# Patient Record
Sex: Male | Born: 1980 | Race: White | Hispanic: No | Marital: Single | Smoking: Current every day smoker
Health system: Southern US, Community
[De-identification: ages and names within clinical notes are randomized; demographics above are authoritative.]

## PROBLEM LIST (undated history)

## (undated) DIAGNOSIS — F1990 Other psychoactive substance use, unspecified, uncomplicated: Secondary | ICD-10-CM

## (undated) HISTORY — PX: OTHER SURGICAL HISTORY: SHX169

---

## 2009-08-21 ENCOUNTER — Emergency Department (HOSPITAL_COMMUNITY): Admission: EM | Admit: 2009-08-21 | Discharge: 2009-08-21 | Payer: Self-pay | Admitting: Emergency Medicine

## 2011-08-26 ENCOUNTER — Emergency Department (HOSPITAL_COMMUNITY)
Admission: EM | Admit: 2011-08-26 | Discharge: 2011-08-26 | Disposition: A | Discharge status: Home / Self Care | Payer: Self-pay | Attending: Emergency Medicine | Admitting: Emergency Medicine

## 2011-08-26 DIAGNOSIS — R109 Unspecified abdominal pain: Secondary | ICD-10-CM | POA: Insufficient documentation

## 2011-08-26 DIAGNOSIS — K089 Disorder of teeth and supporting structures, unspecified: Secondary | ICD-10-CM | POA: Insufficient documentation

## 2011-08-26 DIAGNOSIS — K029 Dental caries, unspecified: Secondary | ICD-10-CM | POA: Insufficient documentation

## 2011-08-26 DIAGNOSIS — M549 Dorsalgia, unspecified: Secondary | ICD-10-CM | POA: Insufficient documentation

## 2011-08-26 DIAGNOSIS — I10 Essential (primary) hypertension: Secondary | ICD-10-CM | POA: Insufficient documentation

## 2013-02-21 ENCOUNTER — Encounter (HOSPITAL_COMMUNITY): Payer: Self-pay | Admitting: *Deleted

## 2013-02-21 ENCOUNTER — Emergency Department (HOSPITAL_COMMUNITY)
Admission: EM | Admit: 2013-02-21 | Discharge: 2013-02-21 | Disposition: A | Discharge status: Home / Self Care | Payer: Self-pay | Attending: Emergency Medicine | Admitting: Emergency Medicine

## 2013-02-21 DIAGNOSIS — F17200 Nicotine dependence, unspecified, uncomplicated: Secondary | ICD-10-CM | POA: Insufficient documentation

## 2013-02-21 DIAGNOSIS — R1084 Generalized abdominal pain: Secondary | ICD-10-CM | POA: Insufficient documentation

## 2013-02-21 DIAGNOSIS — R5383 Other fatigue: Secondary | ICD-10-CM | POA: Insufficient documentation

## 2013-02-21 DIAGNOSIS — F1123 Opioid dependence with withdrawal: Secondary | ICD-10-CM

## 2013-02-21 DIAGNOSIS — R112 Nausea with vomiting, unspecified: Secondary | ICD-10-CM | POA: Insufficient documentation

## 2013-02-21 DIAGNOSIS — F19939 Other psychoactive substance use, unspecified with withdrawal, unspecified: Secondary | ICD-10-CM | POA: Insufficient documentation

## 2013-02-21 DIAGNOSIS — R5381 Other malaise: Secondary | ICD-10-CM | POA: Insufficient documentation

## 2013-02-21 DIAGNOSIS — R197 Diarrhea, unspecified: Secondary | ICD-10-CM | POA: Insufficient documentation

## 2013-02-21 DIAGNOSIS — R6883 Chills (without fever): Secondary | ICD-10-CM | POA: Insufficient documentation

## 2013-02-21 DIAGNOSIS — M549 Dorsalgia, unspecified: Secondary | ICD-10-CM | POA: Insufficient documentation

## 2013-02-21 MED ORDER — PROCHLORPERAZINE EDISYLATE 5 MG/ML IJ SOLN
10.0000 mg | Freq: Once | INTRAMUSCULAR | Status: AC
  Administered 2013-02-21: 10 mg via INTRAVENOUS
  Filled 2013-02-21: qty 2

## 2013-02-21 MED ORDER — CLONIDINE HCL 0.1 MG PO TABS
0.1000 mg | ORAL_TABLET | Freq: Once | ORAL | Status: AC
  Administered 2013-02-21: 0.1 mg via ORAL
  Filled 2013-02-21 (×2): qty 1

## 2013-02-21 MED ORDER — LORAZEPAM 2 MG/ML IJ SOLN
1.0000 mg | Freq: Once | INTRAMUSCULAR | Status: AC
  Administered 2013-02-21: 1 mg via INTRAVENOUS
  Filled 2013-02-21: qty 1

## 2013-02-21 MED ORDER — PROMETHAZINE HCL 25 MG/ML IJ SOLN
25.0000 mg | Freq: Once | INTRAMUSCULAR | Status: AC
  Administered 2013-02-21: 25 mg via INTRAVENOUS
  Filled 2013-02-21: qty 1

## 2013-02-21 MED ORDER — CLONIDINE HCL 0.2 MG PO TABS: 0.1000 mg | ORAL_TABLET | Freq: Two times a day (BID) | ORAL | Status: DC

## 2013-02-21 MED ORDER — IBUPROFEN 400 MG PO TABS: 600.0000 mg | ORAL_TABLET | Freq: Four times a day (QID) | ORAL | Status: DC | PRN

## 2013-02-21 MED ORDER — PROMETHAZINE HCL 25 MG PO TABS: 25.0000 mg | ORAL_TABLET | Freq: Four times a day (QID) | ORAL | Status: DC | PRN

## 2013-02-21 MED ORDER — CLONIDINE HCL 0.1 MG PO TABS
0.1000 mg | ORAL_TABLET | Freq: Once | ORAL | Status: AC
  Administered 2013-02-21: 0.1 mg via ORAL
  Filled 2013-02-21: qty 1

## 2013-02-21 MED ORDER — PROMETHAZINE HCL 25 MG/ML IJ SOLN
25.0000 mg | Freq: Once | INTRAMUSCULAR | Status: DC
  Filled 2013-02-21: qty 1

## 2013-02-21 NOTE — ED Notes (Signed)
Pt unable to answer questions, is moaning, denies any pain at this time

## 2013-02-21 NOTE — ED Provider Notes (Signed)
History     CSN: [id number]  Arrival date & time 02/21/13  1355   First MD Initiated Contact with Patient 02/21/13 1408      Chief Complaint  Patient presents with  . Withdrawal   Level 5 caveat due to altered mental status.  (Consider location/radiation/quality/duration/timing/severity/associated sxs/prior treatment) The history is provided by the patient.   patient presents in opioid withdrawal period he ran out of his methadone yesterday and got from a friend what he thought was more methadone. It was actually naltrexone and he began to feel bad. He's had nausea without vomiting. He's had diarrhea. He states that he hurts all over. He is moaning and somewhat uncooperative with examination.  History reviewed. No pertinent past medical history.  History reviewed. No pertinent past surgical history.  No family history on file.  History  Substance Use Topics  . Smoking status: Current Every Day Smoker  . Smokeless tobacco: Not on file  . Alcohol Use: No      Review of Systems  Unable to perform ROS: Acuity of condition  Constitutional: Positive for chills and fatigue.  HENT: Negative for neck stiffness.   Respiratory: Negative for cough and shortness of breath.   Gastrointestinal: Positive for nausea, abdominal pain and diarrhea. Negative for vomiting.  Genitourinary: Positive for flank pain.  Musculoskeletal: Positive for back pain.  Neurological: Negative for headaches.    Allergies  Review of patient's allergies indicates no known allergies.  Home Medications  No current outpatient prescriptions on file.  BP 149/73  Pulse 66  Temp(Src) 98.1 F (36.7 C) (Oral)  Resp 18  SpO2 100%  Physical Exam  Nursing note and vitals reviewed. Constitutional: He appears well-developed and well-nourished.  HENT:  Head: Normocephalic and atraumatic.  Eyes: EOM are normal.  Pupils are dilated bilaterally  Neck: Normal range of motion. Neck supple.  Cardiovascular:  Normal rate, regular rhythm and normal heart sounds.   No murmur heard. Pulmonary/Chest: Effort normal and breath sounds normal.  Abdominal: Soft. Bowel sounds are normal. He exhibits no distension and no mass. There is tenderness. There is no rebound and no guarding.  Mild diffuse abdominal tenderness.  Musculoskeletal: Normal range of motion. He exhibits no edema.  Neurological: He is alert. No cranial nerve deficit.  Patient is moaning in bed it is somewhat difficult to understand. He will follow commands.  Skin: Skin is warm and dry.  Psychiatric: He has a normal mood and affect.    ED Course  Procedures (including critical care time)  Labs Reviewed - No data to display No results found.   1. Opiate withdrawal       MDM  Patient with apparent opioid withdrawal after getting naltrexone instead of methadone. Patient's had some diarrhea. Has been given symptomatic relief. Will monitor for control of symptoms.        Juliet Rude. Rubin Payor, MD 02/21/13 1610

## 2013-02-21 NOTE — ED Notes (Signed)
AVW:[id number]<XB> Expected date:<BR> Expected time:<BR> Means of arrival:<BR> Comments:<BR> Withdrawal/combative
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
## 2013-02-21 NOTE — ED Notes (Signed)
Poison control called, recommends clonidine 0.1mg  x3 daily

## 2013-02-21 NOTE — ED Provider Notes (Signed)
Pt with hx of being on methadone, took revia/narcan today - and went into opiod withdrawal. I assumed care of this patient from Dr. Rubin Payor. Pt was extremely uncomfortable - so we continued to monitor him. I also called Poison control, to ask them when he can restart his methadone - as family wanted him to be started on methadone immediately. The recs from Poison Control was to give patient adjunct - 0.1 mg clonidine tid and short acting opoids at our discretion an dto start methadone as per schedule tomorrow. Pt took the clonidine and felt a little better. Plan was for me to give him another round of clonidine here, and then discharge to family at 11 pm.   Derwood Kaplan, MD 02/21/13 2312

## 2013-03-23 ENCOUNTER — Encounter (HOSPITAL_COMMUNITY): Payer: Self-pay | Admitting: Emergency Medicine

## 2013-03-23 ENCOUNTER — Emergency Department (HOSPITAL_COMMUNITY)
Admission: EM | Admit: 2013-03-23 | Discharge: 2013-03-23 | Disposition: A | Discharge status: Home / Self Care | Payer: Self-pay | Attending: Emergency Medicine | Admitting: Emergency Medicine

## 2013-03-23 DIAGNOSIS — F17200 Nicotine dependence, unspecified, uncomplicated: Secondary | ICD-10-CM | POA: Insufficient documentation

## 2013-03-23 DIAGNOSIS — K029 Dental caries, unspecified: Secondary | ICD-10-CM | POA: Insufficient documentation

## 2013-03-23 DIAGNOSIS — Z79899 Other long term (current) drug therapy: Secondary | ICD-10-CM | POA: Insufficient documentation

## 2013-03-23 DIAGNOSIS — K089 Disorder of teeth and supporting structures, unspecified: Secondary | ICD-10-CM | POA: Insufficient documentation

## 2013-03-23 DIAGNOSIS — K0889 Other specified disorders of teeth and supporting structures: Secondary | ICD-10-CM

## 2013-03-23 MED ORDER — PENICILLIN V POTASSIUM 500 MG PO TABS: 500.0000 mg | ORAL_TABLET | Freq: Three times a day (TID) | ORAL | Status: DC

## 2013-03-23 MED ORDER — IBUPROFEN 600 MG PO TABS: 600.0000 mg | ORAL_TABLET | Freq: Four times a day (QID) | ORAL | Status: DC | PRN

## 2013-03-23 NOTE — ED Notes (Signed)
Pt c/o right lower dental pain x 2 weeks that is having increased pain

## 2013-03-23 NOTE — ED Provider Notes (Signed)
History     CSN: [id number]  Arrival date & time 03/23/13  4098   First MD Initiated Contact with Patient 03/23/13 [phone]      Chief Complaint  Patient presents with  . Dental Pain    (Consider location/radiation/quality/duration/timing/severity/associated sxs/prior treatment) HPI Comments: Patient presents with complaint of right mandibular tooth pain and swelling for the past several weeks worsening over the past 2 days. Patient takes methadone and has been gargling salt water but states that this does not help. No trouble breathing, fever, neck swelling, facial swelling. The onset of this condition was acute. The course is constant. Aggravating factors: none. Alleviating factors: none.    Patient is a 32 y.o. male presenting with tooth pain. The history is provided by the patient.  Dental PainPrimary symptoms do not include headaches, fever, shortness of breath or sore throat.  Additional symptoms do not include: facial swelling, trouble swallowing and ear pain.    History reviewed. No pertinent past medical history.  History reviewed. No pertinent past surgical history.  History reviewed. No pertinent family history.  History  Substance Use Topics  . Smoking status: Current Every Day Smoker  . Smokeless tobacco: Not on file  . Alcohol Use: No      Review of Systems  Constitutional: Negative for fever.  HENT: Positive for dental problem. Negative for ear pain, sore throat, facial swelling, trouble swallowing and neck pain.   Respiratory: Negative for shortness of breath and stridor.   Skin: Negative for color change.  Neurological: Negative for headaches.    Allergies  Review of patient's allergies indicates no known allergies.  Home Medications   Current Outpatient Rx  Name  Route  Sig  Dispense  Refill  . methadone (DOLOPHINE) 10 MG/5ML solution   Oral   Take 125 mg by mouth daily.         Marland Kitchen ibuprofen (ADVIL,MOTRIN) 600 MG tablet   Oral   Take 1 tablet  (600 mg total) by mouth every 6 (six) hours as needed for pain.   20 tablet   0   . penicillin v potassium (VEETID) 500 MG tablet   Oral   Take 1 tablet (500 mg total) by mouth 3 (three) times daily.   21 tablet   0     BP 150/94  Pulse 81  Temp(Src) 98.5 F (36.9 C) (Oral)  Resp 18  SpO2 97%  Physical Exam  Nursing note and vitals reviewed. Constitutional: He appears well-developed and well-nourished.  HENT:  Head: Normocephalic and atraumatic. No trismus in the jaw.  Right Ear: Tympanic membrane, external ear and ear canal normal.  Left Ear: Tympanic membrane, external ear and ear canal normal.  Nose: Nose normal.  Mouth/Throat: Uvula is midline, oropharynx is clear and moist and mucous membranes are normal. Abnormal dentition. Dental caries present. No dental abscesses or edematous. No tonsillar abscesses.  Teeth in poor repair with advanced periodontal disease. Multiple active caries noted. Patient with broken tooth, right mandibular first molar with tenderness and erythema of the gumline in that area. No facial swelling or definite abscess.  Eyes: Pupils are equal, round, and reactive to light.  Neck: Normal range of motion. Neck supple.  No neck swelling or Lugwig's angina  Neurological: He is alert.  Skin: Skin is warm and dry.  Psychiatric: He has a normal mood and affect.    ED Course  Procedures (including critical care time)  Labs Reviewed - No data to display No results found.  1. Toothache    Please read and follow all provided instructions.  Your diagnoses today include:  1. Toothache    9:58 AM Patient seen and examined. Work-up initiated. Medications ordered.   Vital signs reviewed and are as follows: Filed Vitals:   03/23/13 0940  BP: 150/94  Pulse: 81  Temp: 98.5 F (36.9 C)  Resp: 18    Patient counseled to take prescribed medications as directed, return with worsening facial or neck swelling, and to follow-up with their dentist as  soon as possible.      MDM  Patient with toothache.  No gross abscess but I suspect early infection.  Exam unconcerning for Ludwig's angina or other deep tissue infection in neck.  Will treat with penicillin and pain medicine.  Urged patient to follow-up with dentist.          Renne Crigler, PA-C 03/23/13 1000

## 2013-03-24 NOTE — ED Provider Notes (Signed)
Medical screening examination/treatment/procedure(s) were performed by non-physician practitioner and as supervising physician I was immediately available for consultation/collaboration.   Suzi Roots, MD 03/24/13 [phone]

## 2013-11-16 ENCOUNTER — Encounter (HOSPITAL_COMMUNITY): Payer: Self-pay | Admitting: Emergency Medicine

## 2013-11-16 ENCOUNTER — Emergency Department (HOSPITAL_COMMUNITY): Payer: Self-pay

## 2013-11-16 ENCOUNTER — Emergency Department (HOSPITAL_COMMUNITY)
Admission: EM | Admit: 2013-11-16 | Discharge: 2013-11-16 | Disposition: A | Discharge status: Home / Self Care | Payer: Self-pay | Attending: Emergency Medicine | Admitting: Emergency Medicine

## 2013-11-16 DIAGNOSIS — IMO0002 Reserved for concepts with insufficient information to code with codable children: Secondary | ICD-10-CM | POA: Insufficient documentation

## 2013-11-16 DIAGNOSIS — L02512 Cutaneous abscess of left hand: Secondary | ICD-10-CM

## 2013-11-16 DIAGNOSIS — Z79899 Other long term (current) drug therapy: Secondary | ICD-10-CM | POA: Insufficient documentation

## 2013-11-16 DIAGNOSIS — F17200 Nicotine dependence, unspecified, uncomplicated: Secondary | ICD-10-CM | POA: Insufficient documentation

## 2013-11-16 MED ORDER — CLINDAMYCIN HCL 150 MG PO CAPS: 450.0000 mg | ORAL_CAPSULE | Freq: Three times a day (TID) | ORAL | Status: AC

## 2013-11-16 MED ORDER — CLINDAMYCIN HCL 300 MG PO CAPS
450.0000 mg | ORAL_CAPSULE | Freq: Once | ORAL | Status: DC
  Filled 2013-11-16 (×2): qty 1

## 2013-11-16 NOTE — ED Notes (Signed)
Patient transported to X-ray 

## 2013-11-16 NOTE — ED Notes (Signed)
Pt dc to home. Pt sts understanding to dc instructions. Pt ambulatory to exit without difficulty. 
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
## 2013-11-16 NOTE — ED Notes (Addendum)
Pt is here with left ring finger infection and some discoloration after cutting it on a can

## 2013-11-22 NOTE — ED Provider Notes (Signed)
I saw and evaluated the patient, reviewed the resident's note and I agree with the findings and plan.  EKG Interpretation   None      Patient finger infection. Drained in ED by Dr. Gordy Levan under my direct supervision. We'll have followup after oral antibiotics.  Juliet Rude. Rubin Payor, MD 11/22/13 1549

## 2015-05-19 ENCOUNTER — Emergency Department (HOSPITAL_COMMUNITY)
Admission: EM | Admit: 2015-05-19 | Discharge: 2015-05-19 | Disposition: A | Discharge status: Home / Self Care | Payer: Self-pay | Attending: Emergency Medicine | Admitting: Emergency Medicine

## 2015-05-19 ENCOUNTER — Encounter (HOSPITAL_COMMUNITY): Payer: Self-pay | Admitting: Emergency Medicine

## 2015-05-19 DIAGNOSIS — Z720 Tobacco use: Secondary | ICD-10-CM | POA: Insufficient documentation

## 2015-05-19 DIAGNOSIS — K088 Other specified disorders of teeth and supporting structures: Secondary | ICD-10-CM | POA: Insufficient documentation

## 2015-05-19 DIAGNOSIS — Z79899 Other long term (current) drug therapy: Secondary | ICD-10-CM | POA: Insufficient documentation

## 2015-05-19 DIAGNOSIS — K029 Dental caries, unspecified: Secondary | ICD-10-CM | POA: Insufficient documentation

## 2015-05-19 DIAGNOSIS — K0889 Other specified disorders of teeth and supporting structures: Secondary | ICD-10-CM

## 2015-05-19 MED ORDER — HYDROCODONE-ACETAMINOPHEN 5-325 MG PO TABS: 2.0000 | ORAL_TABLET | ORAL | Status: DC | PRN

## 2015-05-19 MED ORDER — PENICILLIN V POTASSIUM 500 MG PO TABS: 500.0000 mg | ORAL_TABLET | Freq: Four times a day (QID) | ORAL | Status: AC

## 2015-05-19 MED ORDER — IBUPROFEN 800 MG PO TABS: 800.0000 mg | ORAL_TABLET | Freq: Three times a day (TID) | ORAL | Status: DC
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
## 2015-05-19 NOTE — Discharge Instructions (Signed)
Dental Pain °A tooth ache may be caused by cavities (tooth decay). Cavities expose the nerve of the tooth to air and hot or cold temperatures. It may come from an infection or abscess (also called a boil or furuncle) around your tooth. It is also often caused by dental caries (tooth decay). This causes the pain you are having. °DIAGNOSIS  °Your caregiver can diagnose this problem by exam. °TREATMENT  °· If caused by an infection, it may be treated with medications which kill germs (antibiotics) and pain medications as prescribed by your caregiver. Take medications as directed. °· Only take over-the-counter or prescription medicines for pain, discomfort, or fever as directed by your caregiver. °· Whether the tooth ache today is caused by infection or dental disease, you should see your dentist as soon as possible for further care. °SEEK MEDICAL CARE IF: °The exam and treatment you received today has been provided on an emergency basis only. This is not a substitute for complete medical or dental care. If your problem worsens or new problems (symptoms) appear, and you are unable to meet with your dentist, call or return to this location. °SEEK IMMEDIATE MEDICAL CARE IF:  °· You have a fever. °· You develop redness and swelling of your face, jaw, or neck. °· You are unable to open your mouth. °· You have severe pain uncontrolled by pain medicine. °MAKE SURE YOU:  °· Understand these instructions. °· Will watch your condition. °· Will get help right away if you are not doing well or get worse. °Document Released: 11/11/2005 Document Revised: 02/03/2012 Document Reviewed: 06/29/2008 °ExitCare® Patient Information ©2015 [employer]. This information is not intended to replace advice given to you by your health care provider. Make sure you discuss any questions you have with your health care provider. ° °Dental Care and Dentist Visits °Dental care supports good overall health. Regular dental visits can also help you  avoid dental pain, bleeding, infection, and other more serious health problems in the future. It is important to keep the mouth healthy because diseases in the teeth, gums, and other oral tissues can spread to other areas of the body. Some problems, such as diabetes, heart disease, and pre-term labor have been associated with poor oral health.  °See your dentist every 6 months. If you experience emergency problems such as a toothache or broken tooth, go to the dentist right away. If you see your dentist regularly, you may catch problems early. It is easier to be treated for problems in the early stages.  °WHAT TO EXPECT AT A DENTIST VISIT  °Your dentist will look for many common oral health problems and recommend proper treatment. At your regular dental visit, you can expect: °· Gentle cleaning of the teeth and gums. This includes scraping and polishing. This helps to remove the sticky substance around the teeth and gums (plaque). Plaque forms in the mouth shortly after eating. Over time, plaque hardens on the teeth as tartar. If tartar is not removed regularly, it can cause problems. Cleaning also helps remove stains. °· Periodic X-rays. These pictures of the teeth and supporting bone will help your dentist assess the health of your teeth. °· Periodic fluoride treatments. Fluoride is a natural mineral shown to help strengthen teeth. Fluoride treatment involves applying a fluoride gel or varnish to the teeth. It is most commonly done in children. °· Examination of the mouth, tongue, jaws, teeth, and gums to look for any oral health problems, such as: °¨ Cavities (dental caries). This is   decay on the tooth caused by plaque, sugar, and acid in the mouth. It is best to catch a cavity when it is small. °¨ Inflammation of the gums caused by plaque buildup (gingivitis). °¨ Problems with the mouth or malformed or misaligned teeth. °¨ Oral cancer or other diseases of the soft tissues or jaws.  °KEEP YOUR TEETH AND GUMS  HEALTHY °For healthy teeth and gums, follow these general guidelines as well as your dentist's specific advice: °· Have your teeth professionally cleaned at the dentist every 6 months. °· Brush twice daily with a fluoride toothpaste. °· Floss your teeth daily.  °· Ask your dentist if you need fluoride supplements, treatments, or fluoride toothpaste. °· Eat a healthy diet. Reduce foods and drinks with added sugar. °· Avoid smoking. °TREATMENT FOR ORAL HEALTH PROBLEMS °If you have oral health problems, treatment varies depending on the conditions present in your teeth and gums. °· Your caregiver will most likely recommend good oral hygiene at each visit. °· For cavities, gingivitis, or other oral health disease, your caregiver will perform a procedure to treat the problem. This is typically done at a separate appointment. Sometimes your caregiver will refer you to another dental specialist for specific tooth problems or for surgery. °SEEK IMMEDIATE DENTAL CARE IF: °· You have pain, bleeding, or soreness in the gum, tooth, jaw, or mouth area. °· A permanent tooth becomes loose or separated from the gum socket. °· You experience a blow or injury to the mouth or jaw area. °Document Released: 07/24/2011 Document Revised: 02/03/2012 Document Reviewed: 07/24/2011 °ExitCare® Patient Information ©2015 [employer]. This information is not intended to replace advice given to you by your health care provider. Make sure you discuss any questions you have with your health care provider. ° °

## 2015-05-19 NOTE — ED Notes (Signed)
Pt c/o dental abscess off and on for 2 months. States has been taking Ibuprofen. Left face swollen x 2 days.

## 2015-05-19 NOTE — ED Notes (Signed)
Declined W/C at D/C and was escorted to lobby by RN. 

## 2019-12-18 ENCOUNTER — Encounter (HOSPITAL_COMMUNITY): Payer: Self-pay | Admitting: Emergency Medicine

## 2019-12-18 ENCOUNTER — Other Ambulatory Visit: Payer: Self-pay

## 2019-12-18 ENCOUNTER — Emergency Department (HOSPITAL_COMMUNITY)
Admission: EM | Admit: 2019-12-18 | Discharge: 2019-12-18 | Disposition: A | Discharge status: Home / Self Care | Payer: Self-pay | Attending: Emergency Medicine | Admitting: Emergency Medicine

## 2019-12-18 DIAGNOSIS — F17210 Nicotine dependence, cigarettes, uncomplicated: Secondary | ICD-10-CM | POA: Insufficient documentation

## 2019-12-18 DIAGNOSIS — K047 Periapical abscess without sinus: Secondary | ICD-10-CM | POA: Insufficient documentation

## 2019-12-18 HISTORY — DX: Other psychoactive substance use, unspecified, uncomplicated: F19.90

## 2019-12-18 MED ORDER — CLINDAMYCIN HCL 150 MG PO CAPS: 450.0000 mg | ORAL_CAPSULE | Freq: Three times a day (TID) | ORAL | 0 refills | Status: AC

## 2019-12-18 MED ORDER — IBUPROFEN 800 MG PO TABS
800.0000 mg | ORAL_TABLET | Freq: Once | ORAL | Status: AC
  Administered 2019-12-18: 11:00:00 800 mg via ORAL
  Filled 2019-12-18: qty 1

## 2019-12-18 MED ORDER — NAPROXEN 500 MG PO TABS: 500.0000 mg | ORAL_TABLET | Freq: Two times a day (BID) | ORAL | 0 refills | Status: DC

## 2019-12-18 NOTE — ED Triage Notes (Signed)
C/o L lower dental pain/abcess since yesterday.

## 2019-12-18 NOTE — Discharge Instructions (Signed)
As discussed, you have a dental abscess that in unable to be drained. I am sending you home with an antibiotic. Take 450mg  three times a day. Finish all antibiotics. I am also prescribing you naproxen for pain. You may take twice a day as needed for pain. Do not mix with other over the counter medications. Below are some dental resources. I highly recommend following up with a dentist for further evaluation. Return to the ER for new or worsening symptoms.   Dental Care: Organization         Address  Phone  Notes  [facility] Department of [facility] [place] [phone] Accepts children up to age 37 who are enrolled in [place] or [place]; pregnant women with a Medicaid card; and children who have applied for Medicaid or [place] Health Choice, but were declined, whose parents can pay a reduced fee at time of service.  [facility] Department of [facility]  [address], [place] [phone] Accepts children up to age 71 who are enrolled in [place] or [place]; pregnant women with a Medicaid card; and children who have applied for Medicaid or [place] Health Choice, but were declined, whose parents can pay a reduced fee at time of service.  [place] [facility]  [place] [phone] Patients are seen by appointment only. Walk-ins are not accepted. [place] will see patients 41 years of age and older. Monday - Tuesday (8am-5pm) Most Wednesdays (8:30-5pm) $30 per visit, cash only  [facility]  [address], [facility] [phone] Patients are seen by appointment only. Walk-ins are not accepted. [place] will see patients 73 years of age and older. One Wednesday Evening (Monthly: Volunteer Based).  $30 per visit, cash only  [place]  [phone] for adults; Children under age 24, call Graduate  Pediatric Dentistry at [phone]. Children aged 49-14, please call [phone] to request a pediatric application.  Dental services are provided in all areas of dental care including fillings, crowns and bridges, complete and partial dentures, implants, gum treatment, root canals, and extractions. Preventive care is also provided. Treatment is provided to both adults and children. Patients are selected via a lottery and there is often a waiting list.   [facility] [address] [place]  [phone] [email or web address]   [facility] [address] [place], [place] [phone], Ext. [phone] Second and Fourth Thursday of each month, opens at 6:30 AM; Clinic ends at 9 AM.  Patients are seen on a first-come first-served basis, and a limited number are seen during each clinic.   [facility]  [address] [place] Danker [place], [place] [phone]   Eligibility Requirements You must have lived in [place], [place], or [place] counties for at least the last three months.   You cannot be eligible for state or federal sponsored [employer], including [employer], [place], or [employer].   You generally cannot be eligible for healthcare insurance through your employer.    How to apply: Eligibility screenings are held every Tuesday and Wednesday afternoon from 1:00 pm until 4:00 pm. You do not need an appointment for the interview!  [facility] [address] [place], [place]   [place]  [phone]   [place] Department  [phone]  [facility] Department  [phone]
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
## 2019-12-18 NOTE — ED Notes (Signed)
Patient verbalizes understanding of discharge instructions. Opportunity for questioning and answers were provided. Armband removed by staff, pt discharged from ED. Ambulated out to lobby  

## 2020-02-07 ENCOUNTER — Emergency Department: Payer: Self-pay

## 2020-02-07 ENCOUNTER — Other Ambulatory Visit: Payer: Self-pay

## 2020-02-07 ENCOUNTER — Emergency Department
Admission: EM | Admit: 2020-02-07 | Discharge: 2020-02-07 | Disposition: A | Discharge status: Home / Self Care | Payer: Self-pay | Attending: Emergency Medicine | Admitting: Emergency Medicine

## 2020-02-07 DIAGNOSIS — F17210 Nicotine dependence, cigarettes, uncomplicated: Secondary | ICD-10-CM | POA: Insufficient documentation

## 2020-02-07 DIAGNOSIS — J690 Pneumonitis due to inhalation of food and vomit: Secondary | ICD-10-CM | POA: Insufficient documentation

## 2020-02-07 DIAGNOSIS — T40601A Poisoning by unspecified narcotics, accidental (unintentional), initial encounter: Secondary | ICD-10-CM

## 2020-02-07 DIAGNOSIS — Z791 Long term (current) use of non-steroidal anti-inflammatories (NSAID): Secondary | ICD-10-CM | POA: Insufficient documentation

## 2020-02-07 DIAGNOSIS — T401X1A Poisoning by heroin, accidental (unintentional), initial encounter: Secondary | ICD-10-CM | POA: Insufficient documentation

## 2020-02-07 LAB — CBC WITH DIFFERENTIAL/PLATELET
Abs Immature Granulocytes: 0.02 10*3/uL (ref 0.00–0.07)
Basophils Absolute: 0 10*3/uL (ref 0.0–0.1)
Basophils Relative: 1 %
Eosinophils Absolute: 0.3 10*3/uL (ref 0.0–0.5)
Eosinophils Relative: 4 %
HCT: 40.3 % (ref 39.0–52.0)
Hemoglobin: 13.1 g/dL (ref 13.0–17.0)
Immature Granulocytes: 0 %
Lymphocytes Relative: 22 %
Lymphs Abs: 1.7 10*3/uL (ref 0.7–4.0)
MCH: 29.6 pg (ref 26.0–34.0)
MCHC: 32.5 g/dL (ref 30.0–36.0)
MCV: 91 fL (ref 80.0–100.0)
Monocytes Absolute: 0.6 10*3/uL (ref 0.1–1.0)
Monocytes Relative: 9 %
Neutro Abs: 4.8 10*3/uL (ref 1.7–7.7)
Neutrophils Relative %: 64 %
Platelets: 256 10*3/uL (ref 150–400)
RBC: 4.43 MIL/uL (ref 4.22–5.81)
RDW: 12.5 % (ref 11.5–15.5)
WBC: 7.5 10*3/uL (ref 4.0–10.5)
nRBC: 0 % (ref 0.0–0.2)

## 2020-02-07 LAB — BASIC METABOLIC PANEL
Anion gap: 9 (ref 5–15)
BUN: 18 mg/dL (ref 6–20)
CO2: 27 mmol/L (ref 22–32)
Calcium: 7.9 mg/dL — ABNORMAL LOW (ref 8.9–10.3)
Chloride: 108 mmol/L (ref 98–111)
Creatinine, Ser: 0.99 mg/dL (ref 0.61–1.24)
GFR calc Af Amer: >60 mL/min (ref >60)
GFR calc non Af Amer: >60 mL/min (ref >60)
Glucose, Bld: 119 mg/dL — ABNORMAL HIGH (ref 70–99)
Potassium: 4 mmol/L (ref 3.5–5.1)
Sodium: 144 mmol/L (ref 135–145)

## 2020-02-07 MED ORDER — AMOXICILLIN-POT CLAVULANATE 875-125 MG PO TABS: 1.0000 | ORAL_TABLET | Freq: Two times a day (BID) | ORAL | 0 refills | Status: AC

## 2020-02-07 MED ORDER — NALOXONE HCL 2 MG/2ML IJ SOSY
0.4000 mg | PREFILLED_SYRINGE | Freq: Once | INTRAMUSCULAR | Status: AC
  Administered 2020-02-07: 0.4 mg via INTRAVENOUS
  Filled 2020-02-07: qty 2

## 2020-02-07 MED ORDER — NALOXONE HCL 4 MG/0.1ML NA LIQD: NASAL | 0 refills | Status: AC

## 2020-02-07 MED ORDER — ONDANSETRON HCL 4 MG/2ML IJ SOLN
4.0000 mg | Freq: Once | INTRAMUSCULAR | Status: AC
  Administered 2020-02-07: 19:00:00 4 mg via INTRAVENOUS
  Filled 2020-02-07: qty 2

## 2020-02-07 NOTE — ED Triage Notes (Signed)
Pt arrived via [facility] from home with an overdose. Pt was unresponsive and had to be bagged. Pt does not remember what he took but was given 4 of narcan and 4 of zofran by ems. Pt responsive on arrival.
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
## 2020-02-07 NOTE — ED Notes (Signed)
PT able to ambulate around room with no assistance.Pt continuing to state "that he is just nauseous" when asking if he is ok. EDP made aware.

## 2020-03-04 ENCOUNTER — Other Ambulatory Visit: Payer: Self-pay

## 2020-03-04 ENCOUNTER — Emergency Department (HOSPITAL_COMMUNITY)
Admission: EM | Admit: 2020-03-04 | Discharge: 2020-03-04 | Disposition: A | Discharge status: Home / Self Care | Payer: Self-pay | Attending: Emergency Medicine | Admitting: Emergency Medicine

## 2020-03-04 DIAGNOSIS — L539 Erythematous condition, unspecified: Secondary | ICD-10-CM | POA: Insufficient documentation

## 2020-03-04 DIAGNOSIS — F17200 Nicotine dependence, unspecified, uncomplicated: Secondary | ICD-10-CM | POA: Insufficient documentation

## 2020-03-04 DIAGNOSIS — L03022 Acute lymphangitis of left finger: Secondary | ICD-10-CM | POA: Insufficient documentation

## 2020-03-04 MED ORDER — CLINDAMYCIN HCL 300 MG PO CAPS: 300.0000 mg | ORAL_CAPSULE | Freq: Three times a day (TID) | ORAL | 0 refills | Status: AC

## 2020-03-04 MED ORDER — LIDOCAINE HCL (PF) 1 % IJ SOLN
INTRAMUSCULAR | Status: AC
  Filled 2020-03-04: qty 5

## 2020-03-04 MED ORDER — TETANUS-DIPHTH-ACELL PERTUSSIS 5-2.5-18.5 LF-MCG/0.5 IM SUSP: 0.5000 mL | Freq: Once | INTRAMUSCULAR | Status: DC
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
## 2020-03-04 NOTE — ED Triage Notes (Signed)
Pt arrives POV with complaints of an infected right hand pinky finger. 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 It became red and swollen X3 days. Pain 10/10

## 2020-03-04 NOTE — Discharge Instructions (Addendum)
Soak this area and warm soapy water 4 times daily.  Take the antibiotics as prescribed.  Return in 2 days for wound recheck.  If you notice your whole finger starts swelling becomes warm and you are unable to extend her finger you need to seek reevaluation the emergency department.  Tylenol or ibuprofen needed for pain

## 2020-12-05 ENCOUNTER — Encounter (HOSPITAL_COMMUNITY): Payer: Self-pay

## 2020-12-05 ENCOUNTER — Other Ambulatory Visit: Payer: Self-pay

## 2020-12-05 ENCOUNTER — Emergency Department (HOSPITAL_COMMUNITY)
Admission: EM | Admit: 2020-12-05 | Discharge: 2020-12-05 | Disposition: A | Discharge status: Home / Self Care | Payer: Self-pay | Attending: Emergency Medicine | Admitting: Emergency Medicine

## 2020-12-05 DIAGNOSIS — T63301A Toxic effect of unspecified spider venom, accidental (unintentional), initial encounter: Secondary | ICD-10-CM | POA: Insufficient documentation

## 2020-12-05 DIAGNOSIS — Z5321 Procedure and treatment not carried out due to patient leaving prior to being seen by health care provider: Secondary | ICD-10-CM | POA: Insufficient documentation

## 2020-12-05 LAB — COMPREHENSIVE METABOLIC PANEL
ALT: 21 U/L (ref 0–44)
AST: 20 U/L (ref 15–41)
Albumin: 3.4 g/dL — ABNORMAL LOW (ref 3.5–5.0)
Alkaline Phosphatase: 74 U/L (ref 38–126)
Anion gap: 9 (ref 5–15)
BUN: 10 mg/dL (ref 6–20)
CO2: 29 mmol/L (ref 22–32)
Calcium: 9.2 mg/dL (ref 8.9–10.3)
Chloride: 98 mmol/L (ref 98–111)
Creatinine, Ser: 0.81 mg/dL (ref 0.61–1.24)
GFR, Estimated: >60 mL/min (ref >60)
Glucose, Bld: 99 mg/dL (ref 70–99)
Potassium: 4.3 mmol/L (ref 3.5–5.1)
Sodium: 136 mmol/L (ref 135–145)
Total Bilirubin: 0.6 mg/dL (ref 0.3–1.2)
Total Protein: 6.5 g/dL (ref 6.5–8.1)

## 2020-12-05 LAB — CBC
HCT: 38 % — ABNORMAL LOW (ref 39.0–52.0)
Hemoglobin: 12.4 g/dL — ABNORMAL LOW (ref 13.0–17.0)
MCH: 29.4 pg (ref 26.0–34.0)
MCHC: 32.6 g/dL (ref 30.0–36.0)
MCV: 90 fL (ref 80.0–100.0)
Platelets: 293 10*3/uL (ref 150–400)
RBC: 4.22 MIL/uL (ref 4.22–5.81)
RDW: 12.3 % (ref 11.5–15.5)
WBC: 13.7 10*3/uL — ABNORMAL HIGH (ref 4.0–10.5)
nRBC: 0 % (ref 0.0–0.2)

## 2020-12-05 MED ORDER — ACETAMINOPHEN 325 MG PO TABS
650.0000 mg | ORAL_TABLET | Freq: Once | ORAL | Status: AC
  Administered 2020-12-05: 650 mg via ORAL

## 2020-12-05 NOTE — ED Notes (Signed)
Called for vitals x3, no answer. 

## 2020-12-05 NOTE — ED Triage Notes (Signed)
Pt reports he is here toady due to insect bite. Pt reports that x1 week ago he had a spider bite. Pt reports it started red and grew in size ,redness, and is swollen now. Pt reports drainage coming from wound.

## 2021-03-23 ENCOUNTER — Emergency Department (HOSPITAL_COMMUNITY): Payer: Self-pay

## 2021-03-23 ENCOUNTER — Encounter (HOSPITAL_COMMUNITY): Payer: Self-pay | Admitting: Internal Medicine

## 2021-03-23 ENCOUNTER — Inpatient Hospital Stay (HOSPITAL_COMMUNITY)
Admission: EM | Admit: 2021-03-23 | Discharge: 2021-03-25 | DRG: 558 | Disposition: A | Discharge status: Home / Self Care | Payer: Self-pay | Attending: Internal Medicine | Admitting: Internal Medicine

## 2021-03-23 ENCOUNTER — Other Ambulatory Visit: Payer: Self-pay

## 2021-03-23 DIAGNOSIS — M60004 Infective myositis, unspecified left leg: Principal | ICD-10-CM | POA: Diagnosis present

## 2021-03-23 DIAGNOSIS — F17210 Nicotine dependence, cigarettes, uncomplicated: Secondary | ICD-10-CM | POA: Diagnosis present

## 2021-03-23 DIAGNOSIS — F1990 Other psychoactive substance use, unspecified, uncomplicated: Secondary | ICD-10-CM | POA: Diagnosis present

## 2021-03-23 DIAGNOSIS — L97829 Non-pressure chronic ulcer of other part of left lower leg with unspecified severity: Secondary | ICD-10-CM | POA: Diagnosis present

## 2021-03-23 DIAGNOSIS — L02416 Cutaneous abscess of left lower limb: Secondary | ICD-10-CM | POA: Diagnosis present

## 2021-03-23 DIAGNOSIS — M60062 Infective myositis, left lower leg: Principal | ICD-10-CM | POA: Diagnosis present

## 2021-03-23 DIAGNOSIS — L03116 Cellulitis of left lower limb: Secondary | ICD-10-CM | POA: Diagnosis present

## 2021-03-23 DIAGNOSIS — L03119 Cellulitis of unspecified part of limb: Secondary | ICD-10-CM | POA: Diagnosis present

## 2021-03-23 DIAGNOSIS — F1120 Opioid dependence, uncomplicated: Secondary | ICD-10-CM | POA: Diagnosis present

## 2021-03-23 DIAGNOSIS — Z20822 Contact with and (suspected) exposure to covid-19: Secondary | ICD-10-CM | POA: Diagnosis present

## 2021-03-23 DIAGNOSIS — L02419 Cutaneous abscess of limb, unspecified: Secondary | ICD-10-CM | POA: Diagnosis present

## 2021-03-23 DIAGNOSIS — F1920 Other psychoactive substance dependence, uncomplicated: Secondary | ICD-10-CM | POA: Diagnosis present

## 2021-03-23 DIAGNOSIS — B9562 Methicillin resistant Staphylococcus aureus infection as the cause of diseases classified elsewhere: Secondary | ICD-10-CM | POA: Diagnosis present

## 2021-03-23 LAB — COMPREHENSIVE METABOLIC PANEL
ALT: 25 U/L (ref 0–44)
AST: 50 U/L — ABNORMAL HIGH (ref 15–41)
Albumin: 3 g/dL — ABNORMAL LOW (ref 3.5–5.0)
Alkaline Phosphatase: 70 U/L (ref 38–126)
Anion gap: 10 (ref 5–15)
BUN: 11 mg/dL (ref 6–20)
CO2: 27 mmol/L (ref 22–32)
Calcium: 9 mg/dL (ref 8.9–10.3)
Chloride: 100 mmol/L (ref 98–111)
Creatinine, Ser: 0.94 mg/dL (ref 0.61–1.24)
GFR, Estimated: >60 mL/min (ref >60)
Glucose, Bld: 185 mg/dL — ABNORMAL HIGH (ref 70–99)
Potassium: 3.9 mmol/L (ref 3.5–5.1)
Sodium: 137 mmol/L (ref 135–145)
Total Bilirubin: 0.3 mg/dL (ref 0.3–1.2)
Total Protein: 6.5 g/dL (ref 6.5–8.1)

## 2021-03-23 LAB — HIV ANTIBODY (ROUTINE TESTING W REFLEX): HIV Screen 4th Generation wRfx: NONREACTIVE

## 2021-03-23 LAB — CBC WITH DIFFERENTIAL/PLATELET
Abs Immature Granulocytes: 0.05 10*3/uL (ref 0.00–0.07)
Basophils Absolute: 0 10*3/uL (ref 0.0–0.1)
Basophils Relative: 0 %
Eosinophils Absolute: 0.2 10*3/uL (ref 0.0–0.5)
Eosinophils Relative: 2 %
HCT: 33.1 % — ABNORMAL LOW (ref 39.0–52.0)
Hemoglobin: 10.8 g/dL — ABNORMAL LOW (ref 13.0–17.0)
Immature Granulocytes: 0 %
Lymphocytes Relative: 17 %
Lymphs Abs: 1.9 10*3/uL (ref 0.7–4.0)
MCH: 29.3 pg (ref 26.0–34.0)
MCHC: 32.6 g/dL (ref 30.0–36.0)
MCV: 89.9 fL (ref 80.0–100.0)
Monocytes Absolute: 1.1 10*3/uL — ABNORMAL HIGH (ref 0.1–1.0)
Monocytes Relative: 9 %
Neutro Abs: 8.3 10*3/uL — ABNORMAL HIGH (ref 1.7–7.7)
Neutrophils Relative %: 72 %
Platelets: 286 10*3/uL (ref 150–400)
RBC: 3.68 MIL/uL — ABNORMAL LOW (ref 4.22–5.81)
RDW: 13 % (ref 11.5–15.5)
WBC: 11.6 10*3/uL — ABNORMAL HIGH (ref 4.0–10.5)
nRBC: 0 % (ref 0.0–0.2)

## 2021-03-23 LAB — RESP PANEL BY RT-PCR (FLU A&B, COVID) ARPGX2
Influenza A by PCR: NEGATIVE
Influenza B by PCR: NEGATIVE
SARS Coronavirus 2 by RT PCR: NEGATIVE

## 2021-03-23 LAB — LACTIC ACID, PLASMA
Lactic Acid, Venous: 2.6 mmol/L (ref 0.5–1.9)
Lactic Acid, Venous: 0.8 mmol/L (ref 0.5–1.9)

## 2021-03-23 MED ORDER — DICYCLOMINE HCL 20 MG PO TABS: 20.0000 mg | ORAL_TABLET | Freq: Four times a day (QID) | ORAL | Status: DC | PRN

## 2021-03-23 MED ORDER — METHOCARBAMOL 500 MG PO TABS: 500.0000 mg | ORAL_TABLET | Freq: Three times a day (TID) | ORAL | Status: DC | PRN

## 2021-03-23 MED ORDER — NAPROXEN 250 MG PO TABS
500.0000 mg | ORAL_TABLET | Freq: Two times a day (BID) | ORAL | Status: DC | PRN
  Filled 2021-03-23: qty 2

## 2021-03-23 MED ORDER — VANCOMYCIN HCL 1500 MG/300ML IV SOLN
1500.0000 mg | Freq: Two times a day (BID) | INTRAVENOUS | Status: DC
  Administered 2021-03-23 – 2021-03-25 (×4): 1500 mg via INTRAVENOUS
  Filled 2021-03-23 (×4): qty 300

## 2021-03-23 MED ORDER — ONDANSETRON HCL 4 MG/2ML IJ SOLN: 4.0000 mg | Freq: Four times a day (QID) | INTRAMUSCULAR | Status: DC | PRN

## 2021-03-23 MED ORDER — HYDROXYZINE HCL 25 MG PO TABS: 25.0000 mg | ORAL_TABLET | Freq: Four times a day (QID) | ORAL | Status: DC | PRN

## 2021-03-23 MED ORDER — ACETAMINOPHEN 650 MG RE SUPP: 650.0000 mg | Freq: Four times a day (QID) | RECTAL | Status: DC | PRN

## 2021-03-23 MED ORDER — LOPERAMIDE HCL 2 MG PO CAPS: 2.0000 mg | ORAL_CAPSULE | ORAL | Status: DC | PRN

## 2021-03-23 MED ORDER — ONDANSETRON HCL 4 MG PO TABS: 4.0000 mg | ORAL_TABLET | Freq: Four times a day (QID) | ORAL | Status: DC | PRN

## 2021-03-23 MED ORDER — SODIUM CHLORIDE 0.9 % IV SOLN
2.0000 g | INTRAVENOUS | Status: DC
  Administered 2021-03-23 – 2021-03-24 (×2): 2 g via INTRAVENOUS
  Filled 2021-03-23 (×2): qty 2

## 2021-03-23 MED ORDER — CLONIDINE HCL 0.1 MG PO TABS: 0.1000 mg | ORAL_TABLET | ORAL | Status: DC

## 2021-03-23 MED ORDER — ACETAMINOPHEN 325 MG PO TABS
650.0000 mg | ORAL_TABLET | Freq: Four times a day (QID) | ORAL | Status: DC | PRN
  Administered 2021-03-23: 650 mg via ORAL
  Filled 2021-03-23: qty 2

## 2021-03-23 MED ORDER — CLONIDINE HCL 0.1 MG PO TABS: 0.1000 mg | ORAL_TABLET | Freq: Every day | ORAL | Status: DC

## 2021-03-23 MED ORDER — SODIUM CHLORIDE 0.9 % IV BOLUS
1000.0000 mL | Freq: Once | INTRAVENOUS | Status: AC
  Administered 2021-03-23: 1000 mL via INTRAVENOUS

## 2021-03-23 MED ORDER — SODIUM CHLORIDE 0.9 % IV SOLN
2.0000 g | Freq: Once | INTRAVENOUS | Status: AC
  Administered 2021-03-23: 2 g via INTRAVENOUS
  Filled 2021-03-23: qty 20

## 2021-03-23 MED ORDER — CLONIDINE HCL 0.1 MG PO TABS
0.1000 mg | ORAL_TABLET | Freq: Four times a day (QID) | ORAL | Status: DC
  Administered 2021-03-23 – 2021-03-25 (×7): 0.1 mg via ORAL
  Filled 2021-03-23 (×7): qty 1

## 2021-03-23 MED ORDER — IOHEXOL 300 MG/ML  SOLN
100.0000 mL | Freq: Once | INTRAMUSCULAR | Status: AC | PRN
  Administered 2021-03-23: 100 mL via INTRAVENOUS

## 2021-03-23 MED ORDER — ENOXAPARIN SODIUM 40 MG/0.4ML IJ SOSY
40.0000 mg | PREFILLED_SYRINGE | INTRAMUSCULAR | Status: DC
  Administered 2021-03-23 – 2021-03-24 (×2): 40 mg via SUBCUTANEOUS
  Filled 2021-03-23 (×2): qty 0.4

## 2021-03-23 MED ORDER — VANCOMYCIN HCL 1750 MG/350ML IV SOLN
1750.0000 mg | Freq: Once | INTRAVENOUS | Status: AC
  Administered 2021-03-23: 1750 mg via INTRAVENOUS
  Filled 2021-03-23: qty 350

## 2021-03-23 MED ORDER — ONDANSETRON 4 MG PO TBDP: 4.0000 mg | ORAL_TABLET | Freq: Four times a day (QID) | ORAL | Status: DC | PRN

## 2021-03-23 MED ORDER — MORPHINE SULFATE (PF) 2 MG/ML IV SOLN
2.0000 mg | INTRAVENOUS | Status: DC | PRN
  Administered 2021-03-23 (×2): 4 mg via INTRAVENOUS
  Filled 2021-03-23 (×2): qty 2

## 2021-03-23 NOTE — Progress Notes (Signed)
40 year old gentleman with no medical issues but ongoing injectable heroin use presents with left knee pain and swelling worsening for about 1 week.  Injects mostly on the arms.  Denies trauma.  Patient with severe cellulitis, spreading to the leg.  CT scan with no evidence of underlying abscess or necrotizing infection. There is no knee effusion. Started on vancomycin and ceftriaxone.  Will monitor today.  Any worsening pain or swelling, will talk to surgery.  IV drug use: His blood pressures are stable.  Will start patient on clonidine withdrawal protocol. Multimodal approach including clonidine taper, bentyl, Zofran, Toradol for pain relief.  Seen and examined.  Patient was still in the emergency room.  When I talked to him he was mostly sleepy.  He wants to stay in the hospital get treated for this infection.

## 2021-03-23 NOTE — Progress Notes (Signed)
Pharmacy Antibiotic Note  Brian Gregory is a 40 y.o. male admitted on 03/23/2021 with cellulitis.  Pharmacy has been consulted for vancomycin dosing.  Plan: Vancomycin 1750mg  x1 then 1500mg  IV Q12H. Goal AUC 400-550.  Expected AUC 480.  SCr 0.94.   Height: 5\' 11"  (180.3 cm) Weight: 90.7 kg (200 lb) IBW/kg (Calculated) : 75.3  Temp (24hrs), Avg:98.7 F (37.1 C), Min:98.7 F (37.1 C), Max:98.7 F (37.1 C)  Recent Labs  Lab 03/23/21 0305 03/23/21 0306  WBC 11.6*  --   CREATININE 0.94  --   LATICACIDVEN  --  2.6*    Estimated Creatinine Clearance: 119.2 mL/min (by C-G formula based on SCr of 0.94 mg/dL).    No Known Allergies   Thank you for allowing pharmacy to be a part of this patient's care.  , PharmD, BCPS  03/23/2021 6:15 AM
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
## 2021-03-23 NOTE — ED Triage Notes (Signed)
Pt reports had caught MRSA and had an cyst on L leg  Pt present to the ED  with left leg swelling / infection starting on sat. PT  states he has been shooting ICE and states haven't been "shooting up " for 2 weeks due to leg however pt states used 4 days ago

## 2021-03-24 DIAGNOSIS — L02419 Cutaneous abscess of limb, unspecified: Secondary | ICD-10-CM | POA: Diagnosis present

## 2021-03-24 LAB — BASIC METABOLIC PANEL
Anion gap: 10 (ref 5–15)
BUN: 6 mg/dL (ref 6–20)
CO2: 28 mmol/L (ref 22–32)
Calcium: 8.9 mg/dL (ref 8.9–10.3)
Chloride: 100 mmol/L (ref 98–111)
Creatinine, Ser: 0.73 mg/dL (ref 0.61–1.24)
GFR, Estimated: >60 mL/min (ref >60)
Glucose, Bld: 117 mg/dL — ABNORMAL HIGH (ref 70–99)
Potassium: 4 mmol/L (ref 3.5–5.1)
Sodium: 138 mmol/L (ref 135–145)

## 2021-03-24 LAB — CBC
HCT: 34.9 % — ABNORMAL LOW (ref 39.0–52.0)
Hemoglobin: 11.7 g/dL — ABNORMAL LOW (ref 13.0–17.0)
MCH: 29.2 pg (ref 26.0–34.0)
MCHC: 33.5 g/dL (ref 30.0–36.0)
MCV: 87 fL (ref 80.0–100.0)
Platelets: 338 10*3/uL (ref 150–400)
RBC: 4.01 MIL/uL — ABNORMAL LOW (ref 4.22–5.81)
RDW: 13.2 % (ref 11.5–15.5)
WBC: 12 10*3/uL — ABNORMAL HIGH (ref 4.0–10.5)
nRBC: 0 % (ref 0.0–0.2)

## 2021-03-24 LAB — URINALYSIS, ROUTINE W REFLEX MICROSCOPIC
Bilirubin Urine: NEGATIVE
Glucose, UA: NEGATIVE mg/dL
Hgb urine dipstick: NEGATIVE
Ketones, ur: NEGATIVE mg/dL
Leukocytes,Ua: NEGATIVE
Nitrite: NEGATIVE
Protein, ur: NEGATIVE mg/dL
Specific Gravity, Urine: 1.01 (ref 1.005–1.030)
pH: 7 (ref 5.0–8.0)

## 2021-03-24 LAB — MAGNESIUM: Magnesium: 1.9 mg/dL (ref 1.7–2.4)

## 2021-03-24 LAB — PHOSPHORUS: Phosphorus: 3.3 mg/dL (ref 2.5–4.6)

## 2021-03-24 NOTE — Progress Notes (Signed)
There was a smell of cigarette smoke in patient's room when I went to hang his antibiotic. When I questioned patient about it he denied smoking in the room.

## 2021-03-24 NOTE — Progress Notes (Signed)
PROGRESS NOTE    Brian Gregory  FXT:[id number] DOB: Jul 26, 1981 DOA: 03/23/2021 PCP: Patient, No Pcp Per (Inactive)    Brief Narrative:  40 year old gentleman with no chronic medical issues, ongoing injectable drug use presented to the hospital with left knee pain and swelling that is worse for about a week.  Denies any trauma.  Injection only in the arms not in the legs.  In the emergency room hemodynamically stable.  Patient has spreading cellulitis of the left leg so admitted to the hospital with broad-spectrum antibiotics.   Assessment & Plan:   Principal Problem:   Infective myositis of left lower extremity Active Problems:   IVDU (intravenous drug user)   Polysubstance (including opioids) dependence, daily use (HCC)   Cellulitis and abscess of leg  Cellulitis and abscess of the left leg, spreading cellulitis in a patient with injectable drug use: CT scan with myositis, no underlying abscess, no joint effusion.  Treated conservatively with vancomycin and ceftriaxone, already clinically improving. Blood cultures negative so far. He has a small scab that was removed today and expressed superficial pus from the skin of the left knee, culture sent. Continue to mobilize, no evidence of drainable abscess.  Hopefully the superficial abscess will drain on its own.  IV drug use: Uses heroin.  Last use before coming to the hospital.  Currently no evidence of opiate withdrawals.  He wants to quit and get more help.  Currently trying to work with support group. On clonidine withdrawal protocol with tapering dose of clonidine and other symptom control medication with good response.  Continue.   DVT prophylaxis: enoxaparin (LOVENOX) injection 40 mg Start: 03/23/21 1400   Code Status: Full code Family Communication: None Disposition Plan: Status is: Inpatient  Remains inpatient appropriate because:Inpatient level of care appropriate due to severity of illness   Dispo: The patient  is from: Home              Anticipated d/c is to: Home              Patient currently is not medically stable to d/c.   Difficult to place patient No         Consultants:   None  Procedures:   None  Antimicrobials:  Antibiotics Given (last 72 hours)    Date/Time Action Medication Dose Rate   03/23/21 0259 New Bag/Given   cefTRIAXone (ROCEPHIN) 2 g in sodium chloride 0.9 % 100 mL IVPB 2 g 200 mL/hr   03/23/21 0332 New Bag/Given   vancomycin (VANCOREADY) IVPB 1750 mg/350 mL 1,750 mg 175 mL/hr   03/23/21 1639 New Bag/Given   vancomycin (VANCOREADY) IVPB 1500 mg/300 mL 1,500 mg 150 mL/hr   03/23/21 2340 New Bag/Given   cefTRIAXone (ROCEPHIN) 2 g in sodium chloride 0.9 % 100 mL IVPB 2 g 200 mL/hr   03/24/21 0523 New Bag/Given   vancomycin (VANCOREADY) IVPB 1500 mg/300 mL 1,500 mg 150 mL/hr         Subjective: Patient seen and examined.  No overnight events.  He thinks his leg is better, he was able to walk with it.  He was able to flex his left knee.  Afebrile. There is opening of the wound and pus expressed from the skin, no underlying collection.  Redness and erythema is improving as well knee mobility.  Objective: Vitals:   03/23/21 1944 03/23/21 2336 03/24/21 0359 03/24/21 1154  BP: 132/73 137/85 123/76 139/83  Pulse: 77 92 88 83  Resp: 16 18 14  17  Temp: 99 F (37.2 C) 99.1 F (37.3 C) 98.7 F (37.1 C) 98.2 F (36.8 C)  TempSrc: Oral Oral Oral Oral  SpO2: 100% 100% 98% 99%  Weight:      Height:        Intake/Output Summary (Last 24 hours) at 03/24/2021 1356 Last data filed at 03/24/2021 1300 Gross per 24 hour  Intake 1500 ml  Output --  Net 1500 ml   Filed Weights   03/23/21 0233  Weight: 90.7 kg    Examination:  General exam: Appears calm and comfortable , able to walk around. Respiratory system: Clear to auscultation. Respiratory effort normal.  No added sounds. Cardiovascular system: S1 & S2 heard, RRR. No JVD, murmurs, rubs, gallops or  clicks.  Gastrointestinal system: Abdomen is nondistended, soft and nontender. No organomegaly or masses felt. Normal bowel sounds heard. Central nervous system: Alert and oriented. No focal neurological deficits. Extremities: Symmetric 5 x 5 power. Skin:  Left lower extremity, he has a scab that was removed from the skin of the knee with expression of pus, no evidence of more underlying collection. Fading redness and erythema from the leg. No evidence of patellar tap or effusion.   Data Reviewed: I have personally reviewed following labs and imaging studies  CBC: Recent Labs  Lab 03/23/21 0305 03/24/21 0405  WBC 11.6* 12.0*  NEUTROABS 8.3*  --   HGB 10.8* 11.7*  HCT 33.1* 34.9*  MCV 89.9 87.0  PLT 286 338   Basic Metabolic Panel: Recent Labs  Lab 03/23/21 0305 03/24/21 0405  NA 137 138  K 3.9 4.0  CL 100 100  CO2 27 28  GLUCOSE 185* 117*  BUN 11 6  CREATININE 0.94 0.73  CALCIUM 9.0 8.9  MG  --  1.9  PHOS  --  3.3   GFR: Estimated Creatinine Clearance: 140.1 mL/min (by C-G formula based on SCr of 0.73 mg/dL). Liver Function Tests: Recent Labs  Lab 03/23/21 0305  AST 50*  ALT 25  ALKPHOS 70  BILITOT 0.3  PROT 6.5  ALBUMIN 3.0*   No results for input(s): LIPASE, AMYLASE in the last 168 hours. No results for input(s): AMMONIA in the last 168 hours. Coagulation Profile: No results for input(s): INR, PROTIME in the last 168 hours. Cardiac Enzymes: No results for input(s): CKTOTAL, CKMB, CKMBINDEX, TROPONINI in the last 168 hours. BNP (last 3 results) No results for input(s): PROBNP in the last 8760 hours. HbA1C: No results for input(s): HGBA1C in the last 72 hours. CBG: No results for input(s): GLUCAP in the last 168 hours. Lipid Profile: No results for input(s): CHOL, HDL, LDLCALC, TRIG, CHOLHDL, LDLDIRECT in the last 72 hours. Thyroid Function Tests: No results for input(s): TSH, T4TOTAL, FREET4, T3FREE, THYROIDAB in the last 72 hours. Anemia  Panel: No results for input(s): VITAMINB12, FOLATE, FERRITIN, TIBC, IRON, RETICCTPCT in the last 72 hours. Sepsis Labs: Recent Labs  Lab 03/23/21 0306 03/23/21 1556  LATICACIDVEN 2.6* 0.8    Recent Results (from the past 240 hour(s))  Blood culture (routine x 2)     Status: None (Preliminary result)   Collection Time: 03/23/21  1:35 AM   Specimen: BLOOD  Result Value Ref Range Status   Specimen Description BLOOD RIGHT ARM  Final   Special Requests   Final    BOTTLES DRAWN AEROBIC ONLY Blood Culture results may not be optimal due to an inadequate volume of blood received in culture bottles   Culture   Final    NO  GROWTH 1 DAY Performed at [facility], [address] [place], [place] [address]    Report Status PENDING  Incomplete  Resp Panel by RT-PCR (Flu A&B, Covid) Nasopharyngeal Swab     Status: None   Collection Time: 03/23/21  5:44 AM   Specimen: Nasopharyngeal Swab; Nasopharyngeal(NP) swabs in vial transport medium  Result Value Ref Range Status   SARS Coronavirus 2 by RT PCR NEGATIVE NEGATIVE Final    Comment: (NOTE) SARS-CoV-2 target nucleic acids are NOT DETECTED.  The SARS-CoV-2 RNA is generally detectable in upper respiratory specimens during the acute phase of infection. The lowest concentration of SARS-CoV-2 viral copies this assay can detect is 138 copies/mL. A negative result does not preclude SARS-Cov-2 infection and should not be used as the sole basis for treatment or other patient management decisions. A negative result may occur with  improper specimen collection/handling, submission of specimen other than nasopharyngeal swab, presence of viral mutation(s) within the areas targeted by this assay, and inadequate number of viral copies(<138 copies/mL). A negative result must be combined with clinical observations, patient history, and epidemiological information. The expected result is Negative.  Fact Sheet for Patients:   BloggerCourse.comhttps://www.fda.gov/media/152166/download  Fact Sheet for Healthcare Providers:  SeriousBroker.ithttps://www.fda.gov/media/152162/download  This test is no t yet approved or cleared by the [place] FDA and  has been authorized for detection and/or diagnosis of SARS-CoV-2 by FDA under an Emergency Use Authorization (EUA). This EUA will remain  in effect (meaning this test can be used) for the duration of the COVID-19 declaration under Section 564(b)(1) of the Act, 21 U.S.C.section 360bbb-3(b)(1), unless the authorization is terminated  or revoked sooner.       Influenza A by PCR NEGATIVE NEGATIVE Final   Influenza B by PCR NEGATIVE NEGATIVE Final    Comment: (NOTE) The Xpert Xpress SARS-CoV-2/FLU/RSV plus assay is intended as an aid in the diagnosis of influenza from Nasopharyngeal swab specimens and should not be used as a sole basis for treatment. Nasal washings and aspirates are unacceptable for Xpert Xpress SARS-CoV-2/FLU/RSV testing.  Fact Sheet for Patients: BloggerCourse.comhttps://www.fda.gov/media/152166/download  Fact Sheet for Healthcare Providers: SeriousBroker.ithttps://www.fda.gov/media/152162/download  This test is not yet approved or cleared by the [place] FDA and has been authorized for detection and/or diagnosis of SARS-CoV-2 by FDA under an Emergency Use Authorization (EUA). This EUA will remain in effect (meaning this test can be used) for the duration of the COVID-19 declaration under Section 564(b)(1) of the Act, 21 U.S.C. section 360bbb-3(b)(1), unless the authorization is terminated or revoked.  Performed at [facility], [address] [place], [place] [address]          Radiology Studies: CT EXTREMITY LOWER LEFT W CONTRAST  Result Date: 03/23/2021 CLINICAL DATA:  40 year old male with left lower extremity pain and swelling. Osteomyelitis suspected. EXAM: CT OF THE LOWER LEFT EXTREMITY WITH CONTRAST TECHNIQUE: Multidetector CT imaging of the lower left extremity was  performed according to the standard protocol following intravenous contrast administration. CONTRAST:  100mL OMNIPAQUE IOHEXOL 300 MG/ML  SOLN COMPARISON:  None. FINDINGS: Imaging is provided from about 6 cm above the knee through the midfoot. Distal femur is intact. Patella is included and intact. Tibial plateau intact. Proximal fibula intact. No tibia or fibula osteolysis. Preserved mortise joint alignment. Distal tibia, fibula, talus and calcaneus are intact. Other visible bones of the left foot appear intact and normally aligned. No fracture, dislocation, or osteolysis identified. Only trace fluid is identified in the knee joint. No  ankle joint effusion identified. There is moderate generalized soft tissue swelling and stranding in the visible lower extremity which is most pronounced in the anterior knee, anterior tib fib, and dorsal foot. No soft tissue gas. No organized or drainable fluid collection. There is lesser inflammation in the muscle compartments of the left lower extremity, anterior more so than posterior. No abnormal muscle enhancement identified. The popliteal and major calf and foot arteries are enhancing and appear to be patent. Superficial venous structures appear to be patent and enhancing. There is insufficient deep venous contrast for evaluation. IMPRESSION: 1. Diffuse soft tissue inflammation compatible with cellulitis and myositis throughout the visible the left lower extremity. 2. No CT evidence of osteomyelitis. No abscess. Patent popliteal artery and arterial runoff. Electronically Signed   By: Odessa Fleming M.D.   On: 03/23/2021 04:57        Scheduled Meds: . cloNIDine  0.1 mg Oral QID   Followed by  . [START ON 05-Apr-2021] cloNIDine  0.1 mg Oral BH-qamhs   Followed by  . [START ON 03/28/2021] cloNIDine  0.1 mg Oral QAC breakfast  . enoxaparin (LOVENOX) injection  40 mg Subcutaneous Q24H   Continuous Infusions: . cefTRIAXone (ROCEPHIN)  IV 2 g (03/23/21 2340)  . vancomycin 1,500  mg (03/24/21 0523)     LOS: 0 days    Time spent: 35 minutes    Dorcas Carrow, MD [facility] Pager [phone]

## 2021-03-25 MED ORDER — CIPROFLOXACIN HCL 500 MG PO TABS: 500.0000 mg | ORAL_TABLET | Freq: Two times a day (BID) | ORAL | 0 refills | Status: AC

## 2021-03-26 LAB — AEROBIC CULTURE W GRAM STAIN (SUPERFICIAL SPECIMEN)

## 2021-03-28 LAB — CULTURE, BLOOD (ROUTINE X 2)
Culture: NO GROWTH
Culture: NO GROWTH
Special Requests: ADEQUATE

## 2021-04-25 NOTE — Progress Notes (Signed)
RN gave pt discharge instructions and the patient stated understanding. IV has been removed and 1 new medication escribed to pts home pharmacy. PT is dressed and will let RN know when is ride has arrived.

## 2021-04-25 NOTE — Discharge Summary (Signed)
Physician Discharge Summary  Brian Gregory OHF:[id number] DOB: 03-30-1981 DOA: 03/23/2021  PCP: Patient, No Pcp Per (Inactive)  Admit date: 03/23/2021 Discharge date: 03/31/2021  Admitted From: Home Disposition: Home  Recommendations for Outpatient Follow-up:  1. Follow up with PCP in 1-2 weeks 2. Continue wound care as suggested.  Home Health: Not applicable Equipment/Devices: Not applicable  Discharge Condition: Stable CODE STATUS: Full code Diet recommendation: Regular diet  Discharge summary: 40 year old gentleman with no chronic medical issues, ongoing intermittent injectable heroin use presented to the hospital with left knee pain and swelling for about a week without any trauma.  Does not inject on the legs.  In the emergency room, he is hemodynamically stable.  He had a spreading cellulitis and some purulence from the skin of the knee so admitted to the hospital with broad-spectrum antibiotics.  Cellulitis and abscess of the left leg, spreading cellulitis in a patient with injectable drug use: CT scan with myositis, no underlying abscess, no joint effusion.  Treated conservatively with vancomycin and ceftriaxone, clinically improving.  Blood cultures negative so far. Local wound cultures growing Staphylococcus, suspect MRSA. Spontaneously open skin ulceration with adequate drainage.  Improvement of surrounding cellulitis.  Wants to go home. Since patient wants to go home, he will wash his wound and continue to mobilize to help with drain.  Will prescribe 10 days of ciprofloxacin for broad-spectrum coverage.  IV drug use: Uses heroin.  Last use before coming to the hospital.  Currently no evidence of opiate withdrawals.  He wants to quit and get more help.  Currently trying to work with support group.  Patient did pretty well with no withdrawals.  He was encouraged to go back to outpatient follow-up and his group therapies.  Patient is stable.  Wants to go home.  Discharged  with oral antibiotics and instructions for local wound dressing.  Discharge Diagnoses:  Principal Problem:   Infective myositis of left lower extremity Active Problems:   IVDU (intravenous drug user)   Polysubstance (including opioids) dependence, daily use (HCC)   Cellulitis and abscess of leg    Discharge Instructions  Discharge Instructions    Call MD for:  severe uncontrolled pain   Complete by: As directed    Call MD for:  temperature >100.4   Complete by: As directed    Diet general   Complete by: As directed    Discharge wound care:   Complete by: As directed    Wash with soap and water two times a day , dry it and keep it clean and covered.   Increase activity slowly   Complete by: As directed      Allergies as of Mar 31, 2021   No Known Allergies     Medication List    STOP taking these medications   methadone 10 MG/5ML solution Commonly known as: DOLOPHINE     TAKE these medications   ciprofloxacin 500 MG tablet Commonly known as: Cipro Take 1 tablet (500 mg total) by mouth 2 (two) times daily for 10 days.   naloxone 4 MG/0.1ML Liqd nasal spray kit Commonly known as: NARCAN Take as directed for opioid overdose. Call 911 immediately if used.            Discharge Care Instructions  (From admission, onward)         Start     Ordered   Mar 31, 2021 0000  Discharge wound care:       Comments: Wash with soap and water two times a day ,  dry it and keep it clean and covered.   Apr 19, 2021 0852          No Known Allergies  Consultations:  None   Procedures/Studies: CT EXTREMITY LOWER LEFT W CONTRAST  Result Date: 03/23/2021 CLINICAL DATA:  40 year old male with left lower extremity pain and swelling. Osteomyelitis suspected. EXAM: CT OF THE LOWER LEFT EXTREMITY WITH CONTRAST TECHNIQUE: Multidetector CT imaging of the lower left extremity was performed according to the standard protocol following intravenous contrast administration. CONTRAST:  147mL  OMNIPAQUE IOHEXOL 300 MG/ML  SOLN COMPARISON:  None. FINDINGS: Imaging is provided from about 6 cm above the knee through the midfoot. Distal femur is intact. Patella is included and intact. Tibial plateau intact. Proximal fibula intact. No tibia or fibula osteolysis. Preserved mortise joint alignment. Distal tibia, fibula, talus and calcaneus are intact. Other visible bones of the left foot appear intact and normally aligned. No fracture, dislocation, or osteolysis identified. Only trace fluid is identified in the knee joint. No ankle joint effusion identified. There is moderate generalized soft tissue swelling and stranding in the visible lower extremity which is most pronounced in the anterior knee, anterior tib fib, and dorsal foot. No soft tissue gas. No organized or drainable fluid collection. There is lesser inflammation in the muscle compartments of the left lower extremity, anterior more so than posterior. No abnormal muscle enhancement identified. The popliteal and major calf and foot arteries are enhancing and appear to be patent. Superficial venous structures appear to be patent and enhancing. There is insufficient deep venous contrast for evaluation. IMPRESSION: 1. Diffuse soft tissue inflammation compatible with cellulitis and myositis throughout the visible the left lower extremity. 2. No CT evidence of osteomyelitis. No abscess. Patent popliteal artery and arterial runoff. Electronically Signed   By: Genevie Ann M.D.   On: 03/23/2021 04:57    (Echo, Carotid, EGD, Colonoscopy, ERCP)    Subjective: Patient seen and examined.  Up about and walking in the hallway.  Wants to go home.  Afebrile.  Most of the erythema of the leg and thigh has improved.   Discharge Exam: Vitals:   03/24/21 2312 04/19/21 0438  BP: (!) 146/88 128/84  Pulse: 74 82  Resp: 18 19  Temp: 98.7 F (37.1 C) 98.5 F (36.9 C)  SpO2: 98% 97%   Vitals:   03/24/21 1154 03/24/21 1658 03/24/21 2312 Apr 19, 2021 0438  BP: 139/83  (!) 129/92 (!) 146/88 128/84  Pulse: 83 85 74 82  Resp: $Remo'17 18 18 19  'NFuew$ Temp: 98.2 F (36.8 C) 99.1 F (37.3 C) 98.7 F (37.1 C) 98.5 F (36.9 C)  TempSrc: Oral Oral Oral Oral  SpO2: 99% 98% 98% 97%  Weight:      Height:        General: Pt is alert, awake, not in acute distress Cardiovascular: RRR, S1/S2 +, no rubs, no gallops Respiratory: CTA bilaterally, no wheezing, no rhonchi Abdominal: Soft, NT, ND, bowel sounds + Extremities:  Receding erythema and swelling of the legs and thighs.  Scab removed and has an open wound draining left knee.  No joint swelling or effusion.  Negative for patellar tap.    The results of significant diagnostics from this hospitalization (including imaging, microbiology, ancillary and laboratory) are listed below for reference.     Microbiology: Recent Results (from the past 240 hour(s))  Blood culture (routine x 2)     Status: None (Preliminary result)   Collection Time: 03/23/21  1:35 AM   Specimen: BLOOD  Result  Value Ref Range Status   Specimen Description BLOOD RIGHT ARM  Final   Special Requests   Final    BOTTLES DRAWN AEROBIC ONLY Blood Culture results may not be optimal due to an inadequate volume of blood received in culture bottles   Culture   Final    NO GROWTH 1 DAY Performed at [facility] [address] [place], [place] [address]    Report Status PENDING  Incomplete  Resp Panel by RT-PCR (Flu A&B, Covid) Nasopharyngeal Swab     Status: None   Collection Time: 03/23/21  5:44 AM   Specimen: Nasopharyngeal Swab; Nasopharyngeal(NP) swabs in vial transport medium  Result Value Ref Range Status   SARS Coronavirus 2 by RT PCR NEGATIVE NEGATIVE Final    Comment: (NOTE) SARS-CoV-2 target nucleic acids are NOT DETECTED.  The SARS-CoV-2 RNA is generally detectable in upper respiratory specimens during the acute phase of infection. The lowest concentration of SARS-CoV-2 viral copies this assay can detect is 138 copies/mL. A  negative result does not preclude SARS-Cov-2 infection and should not be used as the sole basis for treatment or other patient management decisions. A negative result may occur with  improper specimen collection/handling, submission of specimen other than nasopharyngeal swab, presence of viral mutation(s) within the areas targeted by this assay, and inadequate number of viral copies(<138 copies/mL). A negative result must be combined with clinical observations, patient history, and epidemiological information. The expected result is Negative.  Fact Sheet for Patients:  EntrepreneurPulse.com.au  Fact Sheet for Healthcare Providers:  IncredibleEmployment.be  This test is no t yet approved or cleared by the [place] FDA and  has been authorized for detection and/or diagnosis of SARS-CoV-2 by FDA under an Emergency Use Authorization (EUA). This EUA will remain  in effect (meaning this test can be used) for the duration of the COVID-19 declaration under Section 564(b)(1) of the Act, 21 U.S.C.section 360bbb-3(b)(1), unless the authorization is terminated  or revoked sooner.       Influenza A by PCR NEGATIVE NEGATIVE Final   Influenza B by PCR NEGATIVE NEGATIVE Final    Comment: (NOTE) The Xpert Xpress SARS-CoV-2/FLU/RSV plus assay is intended as an aid in the diagnosis of influenza from Nasopharyngeal swab specimens and should not be used as a sole basis for treatment. Nasal washings and aspirates are unacceptable for Xpert Xpress SARS-CoV-2/FLU/RSV testing.  Fact Sheet for Patients: EntrepreneurPulse.com.au  Fact Sheet for Healthcare Providers: IncredibleEmployment.be  This test is not yet approved or cleared by the [place] FDA and has been authorized for detection and/or diagnosis of SARS-CoV-2 by FDA under an Emergency Use Authorization (EUA). This EUA will remain in effect (meaning this test can  be used) for the duration of the COVID-19 declaration under Section 564(b)(1) of the Act, 21 U.S.C. section 360bbb-3(b)(1), unless the authorization is terminated or revoked.  Performed at [facility], [place] [address], [place], [place] [address]   Aerobic Culture w Gram Stain (superficial specimen)     Status: None (Preliminary result)   Collection Time: 03/23/21  5:49 AM   Specimen: Abscess; Wound  Result Value Ref Range Status   Specimen Description ABSCESS  Final   Special Requests ABSCESS  Final   Gram Stain   Final    RARE WBC PRESENT,BOTH PMN AND MONONUCLEAR FEW GRAM POSITIVE COCCI IN PAIRS Performed at [facility], [address] [place], [place] [address]    Culture PENDING  Incomplete   Report  Status PENDING  Incomplete     Labs: BNP (last 3 results) No results for input(s): BNP in the last 8760 hours. Basic Metabolic Panel: Recent Labs  Lab 03/23/21 0305 03/24/21 0405  NA 137 138  K 3.9 4.0  CL 100 100  CO2 27 28  GLUCOSE 185* 117*  BUN 11 6  CREATININE 0.94 0.73  CALCIUM 9.0 8.9  MG  --  1.9  PHOS  --  3.3   Liver Function Tests: Recent Labs  Lab 03/23/21 0305  AST 50*  ALT 25  ALKPHOS 70  BILITOT 0.3  PROT 6.5  ALBUMIN 3.0*   No results for input(s): LIPASE, AMYLASE in the last 168 hours. No results for input(s): AMMONIA in the last 168 hours. CBC: Recent Labs  Lab 03/23/21 0305 03/24/21 0405  WBC 11.6* 12.0*  NEUTROABS 8.3*  --   HGB 10.8* 11.7*  HCT 33.1* 34.9*  MCV 89.9 87.0  PLT 286 338   Cardiac Enzymes: No results for input(s): CKTOTAL, CKMB, CKMBINDEX, TROPONINI in the last 168 hours. BNP: Invalid input(s): POCBNP CBG: No results for input(s): GLUCAP in the last 168 hours. D-Dimer No results for input(s): DDIMER in the last 72 hours. Hgb A1c No results for input(s): HGBA1C in the last 72 hours. Lipid Profile No results for input(s): CHOL, HDL, LDLCALC, TRIG, CHOLHDL, LDLDIRECT in the last 72 hours. Thyroid  function studies No results for input(s): TSH, T4TOTAL, T3FREE, THYROIDAB in the last 72 hours.  Invalid input(s): FREET3 Anemia work up No results for input(s): VITAMINB12, FOLATE, FERRITIN, TIBC, IRON, RETICCTPCT in the last 72 hours. Urinalysis    Component Value Date/Time   COLORURINE STRAW (A) 03/24/2021 0920   APPEARANCEUR CLEAR 03/24/2021 0920   LABSPEC 1.010 03/24/2021 0920   PHURINE 7.0 03/24/2021 0920   GLUCOSEU NEGATIVE 03/24/2021 0920   HGBUR NEGATIVE 03/24/2021 0920   BILIRUBINUR NEGATIVE 03/24/2021 0920   KETONESUR NEGATIVE 03/24/2021 0920   PROTEINUR NEGATIVE 03/24/2021 0920   NITRITE NEGATIVE 03/24/2021 0920   LEUKOCYTESUR NEGATIVE 03/24/2021 0920   Sepsis Labs Invalid input(s): PROCALCITONIN,  WBC,  LACTICIDVEN Microbiology Recent Results (from the past 240 hour(s))  Blood culture (routine x 2)     Status: None (Preliminary result)   Collection Time: 03/23/21  1:35 AM   Specimen: BLOOD  Result Value Ref Range Status   Specimen Description BLOOD RIGHT ARM  Final   Special Requests   Final    BOTTLES DRAWN AEROBIC ONLY Blood Culture results may not be optimal due to an inadequate volume of blood received in culture bottles   Culture   Final    NO GROWTH 1 DAY Performed at [facility], [place] [address], [place], [place] [address]    Report Status PENDING  Incomplete  Resp Panel by RT-PCR (Flu A&B, Covid) Nasopharyngeal Swab     Status: None   Collection Time: 03/23/21  5:44 AM   Specimen: Nasopharyngeal Swab; Nasopharyngeal(NP) swabs in vial transport medium  Result Value Ref Range Status   SARS Coronavirus 2 by RT PCR NEGATIVE NEGATIVE Final    Comment: (NOTE) SARS-CoV-2 target nucleic acids are NOT DETECTED.  The SARS-CoV-2 RNA is generally detectable in upper respiratory specimens during the acute phase of infection. The lowest concentration of SARS-CoV-2 viral copies this assay can detect is 138 copies/mL. A negative result does not preclude  SARS-Cov-2 infection and should not be used as the sole basis for treatment or other patient management decisions. A negative result may  occur with  improper specimen collection/handling, submission of specimen other than nasopharyngeal swab, presence of viral mutation(s) within the areas targeted by this assay, and inadequate number of viral copies(<138 copies/mL). A negative result must be combined with clinical observations, patient history, and epidemiological information. The expected result is Negative.  Fact Sheet for Patients:  EntrepreneurPulse.com.au  Fact Sheet for Healthcare Providers:  IncredibleEmployment.be  This test is no t yet approved or cleared by the [place] FDA and  has been authorized for detection and/or diagnosis of SARS-CoV-2 by FDA under an Emergency Use Authorization (EUA). This EUA will remain  in effect (meaning this test can be used) for the duration of the COVID-19 declaration under Section 564(b)(1) of the Act, 21 U.S.C.section 360bbb-3(b)(1), unless the authorization is terminated  or revoked sooner.       Influenza A by PCR NEGATIVE NEGATIVE Final   Influenza B by PCR NEGATIVE NEGATIVE Final    Comment: (NOTE) The Xpert Xpress SARS-CoV-2/FLU/RSV plus assay is intended as an aid in the diagnosis of influenza from Nasopharyngeal swab specimens and should not be used as a sole basis for treatment. Nasal washings and aspirates are unacceptable for Xpert Xpress SARS-CoV-2/FLU/RSV testing.  Fact Sheet for Patients: EntrepreneurPulse.com.au  Fact Sheet for Healthcare Providers: IncredibleEmployment.be  This test is not yet approved or cleared by the [place] FDA and has been authorized for detection and/or diagnosis of SARS-CoV-2 by FDA under an Emergency Use Authorization (EUA). This EUA will remain in effect (meaning this test can be used) for the duration of  the COVID-19 declaration under Section 564(b)(1) of the Act, 21 U.S.C. section 360bbb-3(b)(1), unless the authorization is terminated or revoked.  Performed at [facility], [place] [address], [place], [place] [address]   Aerobic Culture w Gram Stain (superficial specimen)     Status: None (Preliminary result)   Collection Time: 03/23/21  5:49 AM   Specimen: Abscess; Wound  Result Value Ref Range Status   Specimen Description ABSCESS  Final   Special Requests ABSCESS  Final   Gram Stain   Final    RARE WBC PRESENT,BOTH PMN AND MONONUCLEAR FEW GRAM POSITIVE COCCI IN PAIRS Performed at [facility], [address], [place], [place] [address]    Culture PENDING  Incomplete   Report Status PENDING  Incomplete     Time coordinating discharge: 32 minutes  SIGNED:   Barb Merino, MD  [facility] 2021-04-17, 8:52 AM

## 2021-04-25 DEATH — deceased

## 2021-08-10 IMAGING — CT CT EXTREM LOW W/ CM*L*
2 of 3 series · 9 of 33 positions shown, 11 images · IV contrast (omnipaque)
Comparison: None.

CLINICAL DATA: 39-year-old male with left lower extremity pain and
swelling. Osteomyelitis suspected.

EXAM:
CT OF THE LOWER LEFT EXTREMITY WITH CONTRAST
TECHNIQUE: Multidetector CT imaging of the lower left extremity was performed
according to the standard protocol following intravenous contrast
administration.
CONTRAST:  100mL OMNIPAQUE IOHEXOL 300 MG/ML  SOLN

[Series 4: lower ext 1.5 st · axial · 0.48mm/px · z∈[+166,+616]mm · 6 of 390 slices shown, 8 images]
[im 60/390  soft-tissue]
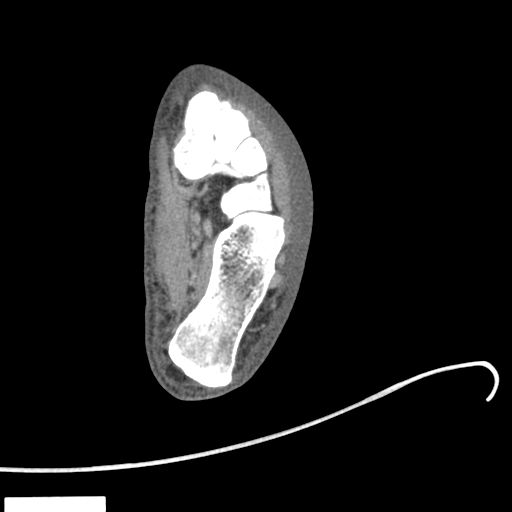
[im 60/390  bone]
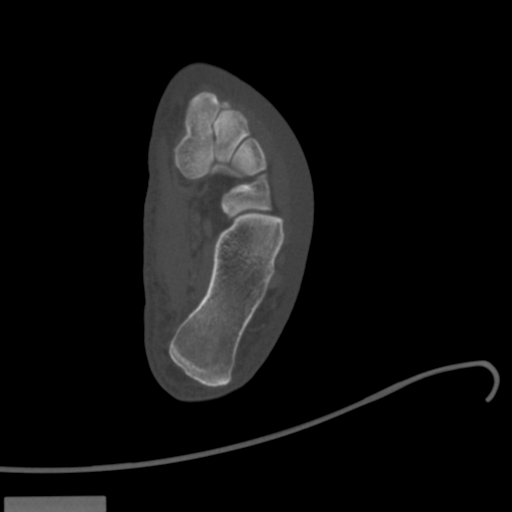
[im 120/390  bone]
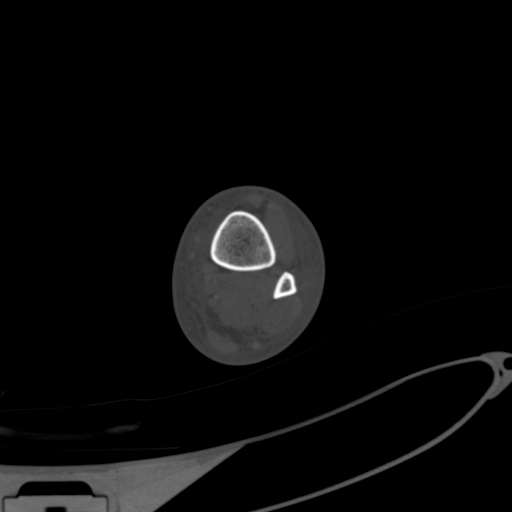
[im 180/390  bone]
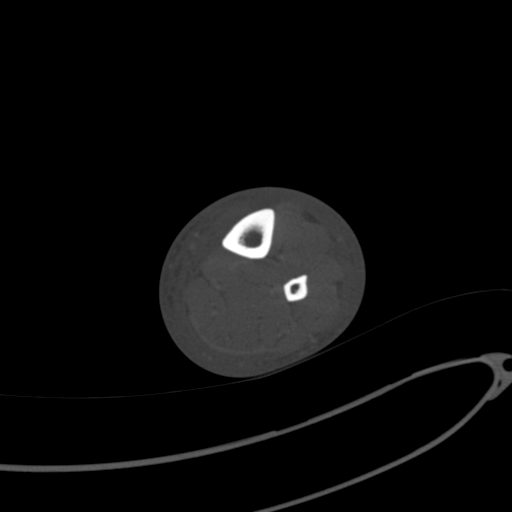
[im 240/390  bone]
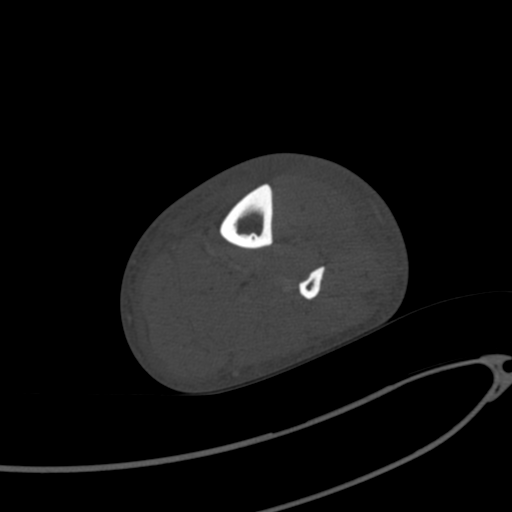
[im 300/390  soft-tissue]
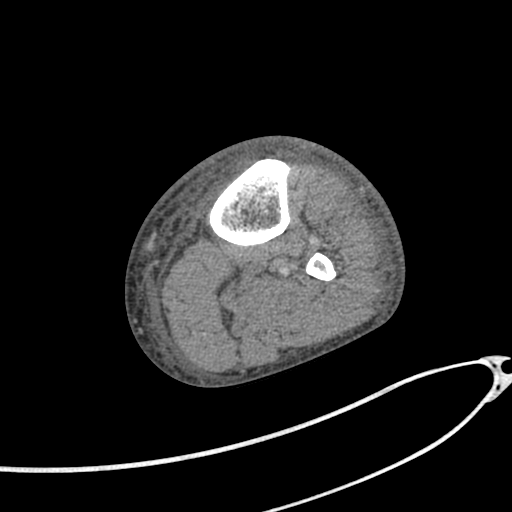
[im 300/390  bone]
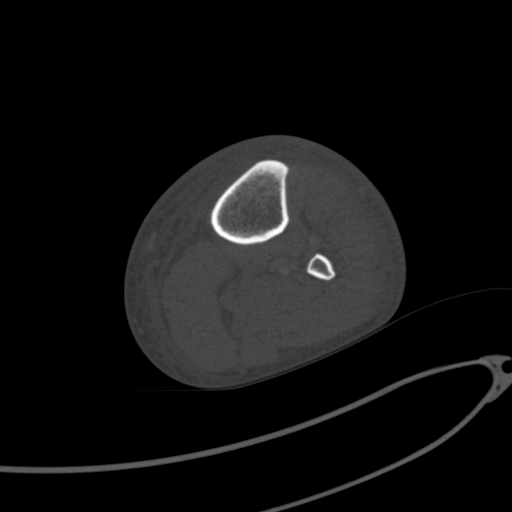
[im 360/390  bone]
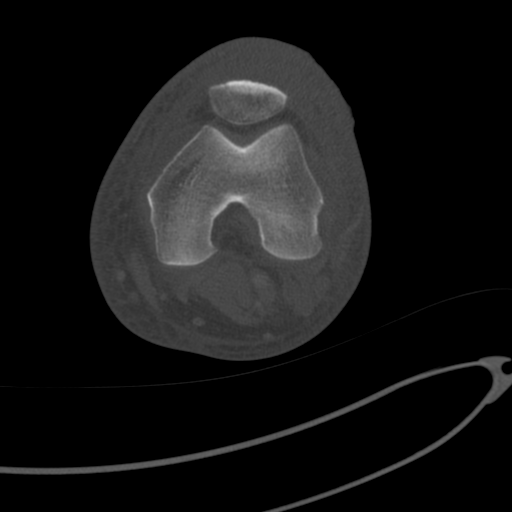

[Series 7: lower ext cor bone · coronal · 0.30mm/px · 3 of 113 slices shown]
[im 23/113  bone]
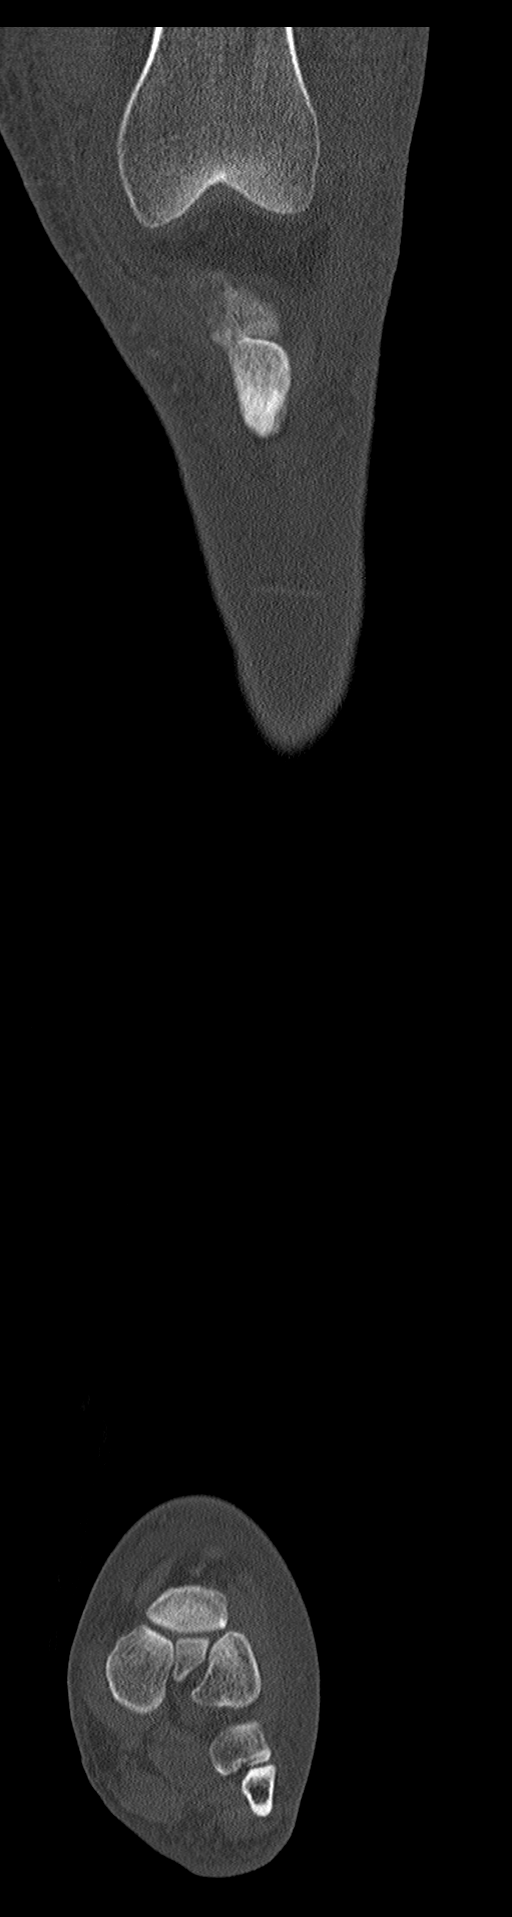
[im 45/113  bone]
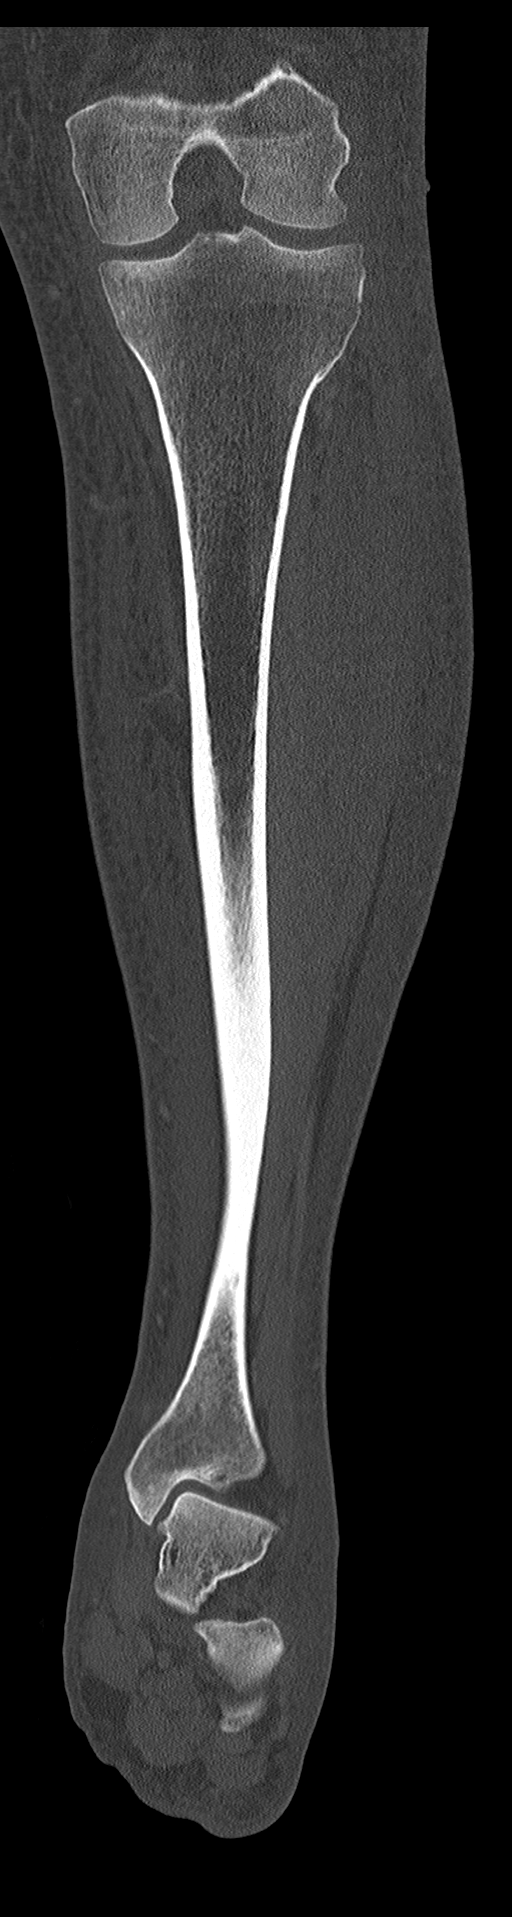
[im 68/113  bone]
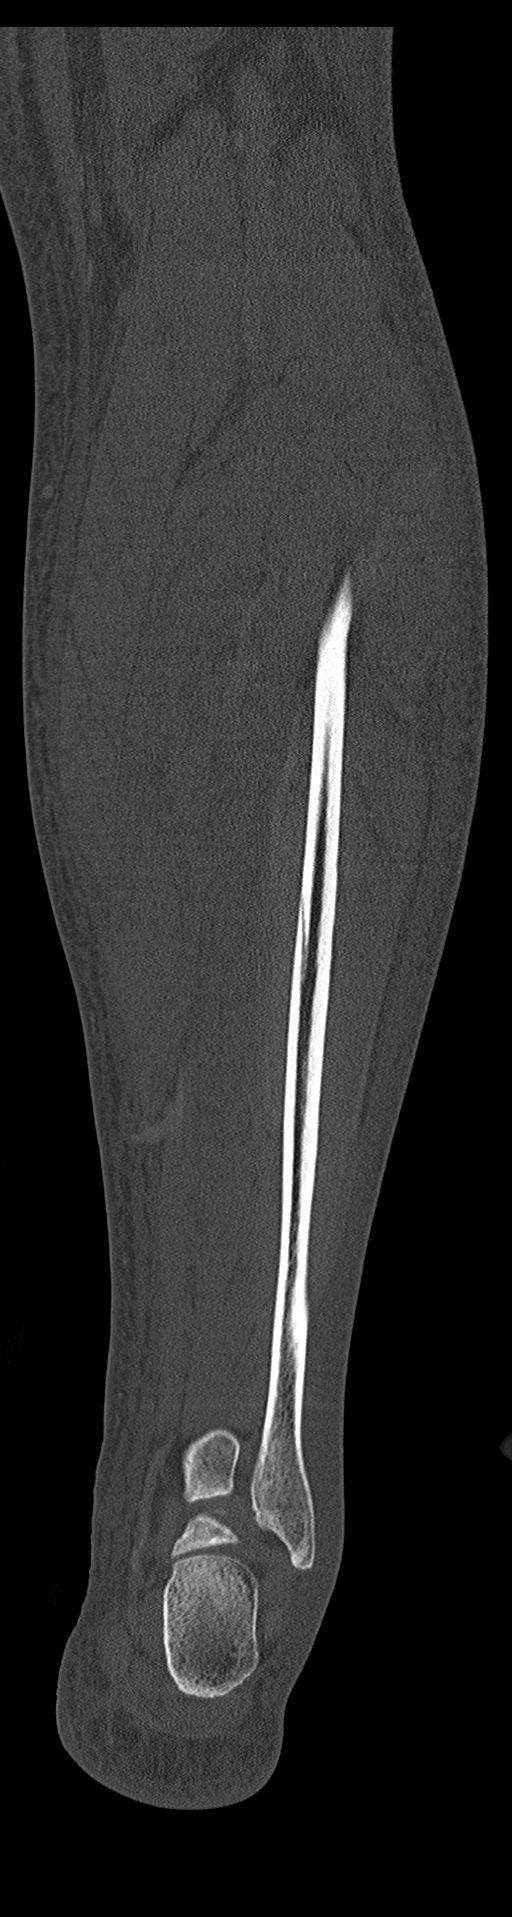

[9 of 33 positions shown; findings below may reference images not displayed]

FINDINGS: Imaging is provided from about 6 cm above the knee through the
midfoot.

Distal femur is intact. Patella is included and intact. Tibial
plateau intact. Proximal fibula intact. No tibia or fibula
osteolysis. Preserved mortise joint alignment. Distal tibia, fibula,
talus and calcaneus are intact. Other visible bones of the left foot
appear intact and normally aligned. No fracture, dislocation, or
osteolysis identified.

Only trace fluid is identified in the knee joint. No ankle joint
effusion identified.

There is moderate generalized soft tissue swelling and stranding in
the visible lower extremity which is most pronounced in the anterior
knee, anterior tib fib, and dorsal foot. No soft tissue gas. No
organized or drainable fluid collection. There is lesser
inflammation in the muscle compartments of the left lower extremity,
anterior more so than posterior. No abnormal muscle enhancement
identified.

The popliteal and major calf and foot arteries are enhancing and
appear to be patent. Superficial venous structures appear to be
patent and enhancing. There is insufficient deep venous contrast for
evaluation.
IMPRESSION: 1. Diffuse soft tissue inflammation compatible with cellulitis and
myositis throughout the visible the left lower extremity.
2. No CT evidence of osteomyelitis. No abscess. Patent popliteal
artery and arterial runoff.
